# Patient Record
Sex: Female | Born: 1950 | Race: White | Hispanic: No | Smoking: Former smoker
Health system: Southern US, Community
[De-identification: ages and names within clinical notes are randomized; demographics above are authoritative.]

## PROBLEM LIST (undated history)

## (undated) DIAGNOSIS — C189 Malignant neoplasm of colon, unspecified: Secondary | ICD-10-CM

## (undated) DIAGNOSIS — Z87442 Personal history of urinary calculi: Secondary | ICD-10-CM

## (undated) DIAGNOSIS — R569 Unspecified convulsions: Secondary | ICD-10-CM

## (undated) DIAGNOSIS — R269 Unspecified abnormalities of gait and mobility: Secondary | ICD-10-CM

## (undated) DIAGNOSIS — G629 Polyneuropathy, unspecified: Secondary | ICD-10-CM

## (undated) DIAGNOSIS — K862 Cyst of pancreas: Secondary | ICD-10-CM

## (undated) DIAGNOSIS — G8929 Other chronic pain: Secondary | ICD-10-CM

## (undated) DIAGNOSIS — R011 Cardiac murmur, unspecified: Secondary | ICD-10-CM

## (undated) DIAGNOSIS — M5440 Lumbago with sciatica, unspecified side: Secondary | ICD-10-CM

## (undated) DIAGNOSIS — M797 Fibromyalgia: Secondary | ICD-10-CM

## (undated) DIAGNOSIS — H539 Unspecified visual disturbance: Secondary | ICD-10-CM

## (undated) DIAGNOSIS — M545 Low back pain: Secondary | ICD-10-CM

## (undated) HISTORY — DX: Unspecified abnormalities of gait and mobility: R26.9

## (undated) HISTORY — DX: Other chronic pain: G89.29

## (undated) HISTORY — DX: Malignant neoplasm of colon, unspecified: C18.9

## (undated) HISTORY — DX: Cyst of pancreas: K86.2

## (undated) HISTORY — DX: Polyneuropathy, unspecified: G62.9

## (undated) HISTORY — DX: Low back pain: M54.5

## (undated) HISTORY — PX: EYE SURGERY: SHX253

## (undated) HISTORY — DX: Unspecified visual disturbance: H53.9

---

## 1997-05-11 HISTORY — PX: ABDOMINAL HYSTERECTOMY: SHX81

## 2010-05-11 DIAGNOSIS — C189 Malignant neoplasm of colon, unspecified: Secondary | ICD-10-CM | POA: Insufficient documentation

## 2010-05-11 HISTORY — DX: Malignant neoplasm of colon, unspecified: C18.9

## 2010-05-11 HISTORY — PX: COLON SURGERY: SHX602

## 2015-05-12 HISTORY — PX: COLONOSCOPY: SHX174

## 2017-08-15 ENCOUNTER — Encounter (HOSPITAL_COMMUNITY): Payer: Self-pay | Admitting: Emergency Medicine

## 2017-08-15 ENCOUNTER — Emergency Department (HOSPITAL_COMMUNITY)
Admission: EM | Admit: 2017-08-15 | Discharge: 2017-08-15 | Disposition: A | Discharge status: Home / Self Care | Payer: Medicare Other | Attending: Emergency Medicine | Admitting: Emergency Medicine

## 2017-08-15 DIAGNOSIS — G894 Chronic pain syndrome: Secondary | ICD-10-CM | POA: Diagnosis not present

## 2017-08-15 DIAGNOSIS — K625 Hemorrhage of anus and rectum: Secondary | ICD-10-CM | POA: Diagnosis not present

## 2017-08-15 DIAGNOSIS — R197 Diarrhea, unspecified: Secondary | ICD-10-CM

## 2017-08-15 DIAGNOSIS — Z85038 Personal history of other malignant neoplasm of large intestine: Secondary | ICD-10-CM | POA: Insufficient documentation

## 2017-08-15 HISTORY — DX: Polyneuropathy, unspecified: G62.9

## 2017-08-15 HISTORY — DX: Fibromyalgia: M79.7

## 2017-08-15 HISTORY — DX: Lumbago with sciatica, unspecified side: M54.40

## 2017-08-15 LAB — CBC
HEMATOCRIT: 40.9 % (ref 36.0–46.0)
Hemoglobin: 13.1 g/dL (ref 12.0–15.0)
MCH: 36.1 pg — ABNORMAL HIGH (ref 26.0–34.0)
MCHC: 32 g/dL (ref 30.0–36.0)
MCV: 112.7 fL — AB (ref 78.0–100.0)
PLATELETS: 307 10*3/uL (ref 150–400)
RBC: 3.63 MIL/uL — AB (ref 3.87–5.11)
RDW: 15.4 % (ref 11.5–15.5)
WBC: 8.6 10*3/uL (ref 4.0–10.5)

## 2017-08-15 LAB — COMPREHENSIVE METABOLIC PANEL
ALT: 39 U/L (ref 14–54)
AST: 35 U/L (ref 15–41)
Albumin: 4.1 g/dL (ref 3.5–5.0)
Alkaline Phosphatase: 133 U/L — ABNORMAL HIGH (ref 38–126)
Anion gap: 12 (ref 5–15)
BILIRUBIN TOTAL: 0.6 mg/dL (ref 0.3–1.2)
BUN: 13 mg/dL (ref 6–20)
CHLORIDE: 102 mmol/L (ref 101–111)
CO2: 24 mmol/L (ref 22–32)
CREATININE: 0.61 mg/dL (ref 0.44–1.00)
Calcium: 9.5 mg/dL (ref 8.9–10.3)
GFR calc Af Amer: >60 mL/min (ref >60)
Glucose, Bld: 116 mg/dL — ABNORMAL HIGH (ref 65–99)
POTASSIUM: 3.7 mmol/L (ref 3.5–5.1)
Sodium: 138 mmol/L (ref 135–145)
TOTAL PROTEIN: 7.9 g/dL (ref 6.5–8.1)

## 2017-08-15 LAB — TYPE AND SCREEN
ABO/RH(D): O POS
ANTIBODY SCREEN: NEGATIVE

## 2017-08-15 MED ORDER — OXYCODONE-ACETAMINOPHEN 5-325 MG PO TABS
1.0000 | ORAL_TABLET | Freq: Once | ORAL | Status: AC
  Administered 2017-08-15: 1 via ORAL
  Filled 2017-08-15: qty 1

## 2017-08-15 NOTE — ED Notes (Signed)
Pt states she is here from [place] and is here due to a family emergency  She repeatedly request pain meds for her  Chronic GI bld from radiation, chemo from her cancer six years ago  She is conversant and manipulative in her request for pain meds speaking to RN of her family event of son going thru a difficult divorce and there being children involved, her rectal bleeding and her need for pain meds

## 2017-08-15 NOTE — ED Triage Notes (Signed)
She is also out of her percocet.

## 2017-08-15 NOTE — ED Triage Notes (Signed)
Pt states that she is having rectal bleeding with diarrhea for the past couple of days

## 2017-08-15 NOTE — ED Notes (Signed)
Orthostatic VS  Lying: 140/81 91,16, pulse ox 97 percent RA  Sitting    143/70  88,16,  Pulse ox 99 percent RA  Standing   142/78   92,18. Pulse Ox 97 percent RA

## 2017-08-15 NOTE — Discharge Instructions (Signed)
Take your medications per their instructions.

## 2017-08-15 NOTE — ED Notes (Signed)
POC occult blood set to room

## 2017-08-16 NOTE — ED Provider Notes (Signed)
[facility] EMERGENCY DEPARTMENT Provider Note   CSN: [id number] Arrival date & time: 08/15/17  1711     History   Chief Complaint Chief Complaint  Patient presents with  . Rectal Bleeding    HPI Carla Dunlap is a 67 y.o. female.  HPI Patient presents for diarrhea and rectal bleeding.  She is visiting from out of state for family emergency.  States she has chronic diarrhea and chronic bleeding since previous colon cancer and surgery.  States that there is a little more bleeding now than normal.  She also states she has severe pain in her back.  States she jumped out of a building previously and has had pain since.  She goes to pain clinic in [place] but is not had her meds for the reportedly last month and a half. States she needs more pain medicines while she is down here.  Is Has dull abdominal pain.  States the pain in her back is schronic.  No new injury.   Past Medical History:  Diagnosis Date  . Back pain of lumbar region with sciatica   . Cancer ([place])   . Fibromyalgia   . Neuropathy     There are no active problems to display for this patient.   History reviewed. No pertinent surgical history.   OB History   None      Home Medications    Prior to Admission medications   Not on File    Family History No family history on file.  Social History Social History   Tobacco Use  . Smoking status: Not on file  Substance Use Topics  . Alcohol use: Not on file  . Drug use: Not on file     Allergies   Anti-inflammatory enzyme [nutritional supplements]; Aspirin; and Penicillins   Review of Systems Review of Systems  Constitutional: Negative for appetite change.  HENT: Negative for congestion.   Respiratory: Negative for shortness of breath.   Gastrointestinal: Positive for blood in stool and diarrhea.  Genitourinary: Negative for flank pain.  Musculoskeletal: Negative for gait problem.  Skin: Negative for rash.  Neurological: Negative for  weakness.  Hematological: Negative for adenopathy.     Physical Exam Updated Vital Signs BP 129/71   Pulse 84   Temp 98.1 F (36.7 C) (Oral)   Resp 16   Ht 5\' 1"  (1.549 m)   Wt 70.3 kg (155 lb)   SpO2 98%   BMI 29.29 kg/m    Physical Exam  Constitutional: She appears well-developed.  HENT:  Head: Normocephalic.  Eyes: Pupils are equal, round, and reactive to light.  Neck: Neck supple.  Cardiovascular: Normal rate.  Pulmonary/Chest: Effort normal.  Abdominal: There is no tenderness.  Musculoskeletal: She exhibits tenderness.  Tender over left lower back.  Skin: Capillary refill takes less than 2 seconds.  Psychiatric: She has a normal mood and affect.     ED Treatments / Results  Labs (all labs ordered are listed, but only abnormal results are displayed) Labs Reviewed  COMPREHENSIVE METABOLIC PANEL - Abnormal; Notable for the following components:      Result Value   Glucose, Bld 116 (*)    Alkaline Phosphatase 133 (*)    All other components within normal limits  CBC - Abnormal; Notable for the following components:   RBC 3.63 (*)    MCV 112.7 (*)    MCH 36.1 (*)    All other components within normal limits  TYPE AND SCREEN    EKG  None  Radiology No results found.  Procedures Procedures (including critical care time)  Medications Ordered in ED Medications  oxyCODONE-acetaminophen (PERCOCET/ROXICET) 5-325 MG per tablet 1 tablet (1 tablet Oral Given 08/15/17 1809)     Initial Impression / Assessment and Plan / ED Course  I have reviewed the triage vital signs and the nursing notes.  Pertinent labs & imaging results that were available during my care of the patient were reviewed by me and considered in my medical decision making (see chart for details).     Patient with chronic diarrhea with chronic GI bleeding.  Hemoglobin reassuring.  Also history of polycythemia.  Patient is rather intent on getting refill of her Percocet for her chronic pain.  I  informed the patient that we cannot refill this.  Patient appears stable in terms of GI bleed.  Given GI follow-up.  Will discharge home.  Final Clinical Impressions(s) / ED Diagnoses   Final diagnoses:  Rectal bleeding  Diarrhea, unspecified type  Chronic pain syndrome    ED Discharge Orders    None      Davonna Belling, MD 08/16/17 1448

## 2017-08-25 ENCOUNTER — Encounter: Payer: Self-pay | Admitting: Gastroenterology

## 2017-08-25 ENCOUNTER — Ambulatory Visit (INDEPENDENT_AMBULATORY_CARE_PROVIDER_SITE_OTHER): Payer: BLUE CROSS/BLUE SHIELD | Admitting: Gastroenterology

## 2017-08-25 DIAGNOSIS — R1030 Lower abdominal pain, unspecified: Secondary | ICD-10-CM | POA: Diagnosis not present

## 2017-08-25 DIAGNOSIS — R197 Diarrhea, unspecified: Secondary | ICD-10-CM

## 2017-08-25 DIAGNOSIS — K219 Gastro-esophageal reflux disease without esophagitis: Secondary | ICD-10-CM | POA: Diagnosis not present

## 2017-08-25 MED ORDER — OMEPRAZOLE 20 MG PO CPDR: DELAYED_RELEASE_CAPSULE | ORAL | 11 refills | Status: DC

## 2017-08-25 NOTE — Assessment & Plan Note (Signed)
ETIOLOGY UNCLEAR. DIFFERENTIAL DIAGNOSIS INCLUDES: ISCHEMIC COLITIS MICROSCOPIC COLITIS, LESS LIKELY COLON CANCER, C DIFF COLITIS, OR IBD.  SUBMIT STOOL STUDY. PLEASE CALL ME IF ANYTHING CHANGES. COMPLETE CT OF THE ABDOMEN AND PELVIS. NO INDICATION FOR ENDOSCOPY AT THIS TIME. FOLLOW UP IN 1 YEAR.

## 2017-08-25 NOTE — Assessment & Plan Note (Addendum)
SYMPTOMS NOT IDEALLY CONTROLLED.  TO CONTROL HEART BURN:    1. AVOID REFLUX TRIGGERS.  HANDOUT GIVEN.    2. START OMEPRAZOLE.  TAKE 30 MINUTES PRIOR TO YOUR FIRST MEAL. FOLLOW UP IN 1 YEAR.

## 2017-08-25 NOTE — Patient Instructions (Addendum)
PLEASE CALL ME IF ANYTHING CHANGES.  TO REDUCE PAIN AND INFLAMMATION, TAKE TURMERIC PILLS DAILY.  CALL THE [facility] TO ESTABLISH PRIMARY CARE.   TO CONTROL HEART BURN:    1. AVOID REFLUX TRIGGERS. SEE INFO BELOW.    2. START OMEPRAZOLE.  TAKE 30 MINUTES PRIOR TO YOUR FIRST MEAL.  TO WORK OF BLOODY STOOLS:   1. COMPLETE CT OF THE ABDOMEN AND PELVIS.   2. SUBMIT STOOL STUDY  FOLLOW UP IN 1 YEAR. PLEASE CALL WITH QUESTIONS OR CONCERNS.

## 2017-08-25 NOTE — Progress Notes (Signed)
Subjective:    Patient ID: Carla Dunlap, female    DOB: 1950/07/20, 67 y.o.   MRN: [id number]  Patient, No Pcp Per  HPI Had colon cancer and had treatment. Left with peripheral neuropathy. Has Fibromyalgia.  And now having LOOSE STOOLS and bloody stools. CAME FROM [place] tired and fighting pain with Tylenol. HAD LABS APR 2019 AND NL Hb AND LIVER ENZYMES. HAD LN INVOLVEMENT WITH COLON CANCER. BMs: 6-7/DAY(#4, 1, AND 5). AND COLONOSCOPY 1-2 YRS AGO.  SEEN IN ED FOR PAIN IN HER BELLY AND TODAY CONTINUES WITH LOWER ABDOMINAL PAIN. DID NOT MOVE BOWELS WELL THIS AM. FEELS CONSTIPATED TODAY. FEELS NAUSEATED EVERY DAY AND TAKES COMPAZINE SINCE SURGERY. LOST SOME WEIGHT ON PURPOSE. LEFT PHILI TO [place] AND EVERYTHING SHE HAD WAS STOLEN. LIVING WITH HER BEST FRIEND. CAN HAVE BM AFTER SPINACH OR PRUNES. VOMITS: 1-2 TIMES A MO. HAD EGD > 5 YRS AGO. OCCASIONAL BLACK TARRY STOOLS.  PT DENIES FEVER, CHILLS,  HEMATEMESIS, CHEST PAIN, SHORTNESS OF BREATH,  problems swallowing, OR problems with sedation.  Past Medical History:  Diagnosis Date  . Back pain of lumbar region with sciatica   . Cancer ([place])   . Fibromyalgia   . Neuropathy    Past Surgical History:  Procedure Laterality Date  . COLON SURGERY  2012  . COLONOSCOPY  2017   NO POLYPS    Allergies  Allergen Reactions  . Anti-Inflammatory Enzyme [Nutritional Supplements]   . Aspirin   . Penicillins    Current Outpatient Medications  Medication Sig Dispense Refill  . clonazePAM (KLONOPIN) 1 MG tablet 1 tablet during the day and 4 tablets at night    . cyclobenzaprine (FLEXERIL) 5 MG tablet Take 5 mg by mouth 3 (three) times daily.    Marland Kitchen FLUoxetine (PROZAC) 20 MG capsule Take 20 mg by mouth daily.    Marland Kitchen levothyroxine (SYNTHROID, LEVOTHROID) 25 MCG tablet Take 25 mcg by mouth daily before breakfast.    . prochlorperazine (COMPAZINE) 10 MG tablet Take 10 mg by mouth daily.    Marland Kitchen topiramate (TOPAMAX) 100 MG tablet Take 100 mg by mouth 2 (two)  times daily.     Family History  Problem Relation Age of Onset  . Colon cancer Neg Hx   . Colon polyps Neg Hx    Social History   Socioeconomic History  . Marital status: Divorced    Spouse name: Not on file  . Number of children: 2 SON IN [place] AND DAUGHTER IN [place]  . Years of education: Not on file  . Highest education level: Not on file  Occupational History  . USED TO BE A HEALER  Social Needs  . Financial resource strain: Not on file  . Food insecurity:    Worry: Not on file    Inability: Not on file  . Transportation needs:    Medical: Not on file    Non-medical: Not on file  Tobacco Use  . Smoking status: Current Every Day Smoker    Packs/day: 0.50  . Smokeless tobacco: Never Used  Substance and Sexual Activity  . Alcohol use: Never    Frequency: Never  . Drug use: Never  . Sexual activity: Not on file  Lifestyle  . Physical activity:    Days per week: Not on file    Minutes per session: Not on file  . Stress: Not on file  Relationships  . Social connections:    Talks on phone: Not on file    Gets  together: Not on file    Attends religious service: Not on file    Active member of club or organization: Not on file    Attends meetings of clubs or organizations: Not on file    Relationship status: Not on file  Other Topics Concern     Social History Narrative      Review of Systems PER HPI OTHERWISE ALL SYSTEMS ARE NEGATIVE.    Objective:   Physical Exam  Constitutional: She is oriented to person, place, and time. She appears well-developed and well-nourished. No distress.  HENT:  Head: Normocephalic and atraumatic.  Mouth/Throat: Oropharynx is clear and moist. No oropharyngeal exudate.  Eyes: Pupils are equal, round, and reactive to light. No scleral icterus.  Neck: Normal range of motion. Neck supple.  Cardiovascular: Normal rate, regular rhythm and normal heart sounds.  Pulmonary/Chest: Effort normal and breath sounds normal. No respiratory distress.    Abdominal: Soft. Bowel sounds are normal. She exhibits no distension. There is tenderness. There is no rebound and no guarding.  MILD BLQs TTP  Musculoskeletal: She exhibits no edema.  WALKS ASSISTED WITH A CANE.  Lymphadenopathy:    She has no cervical adenopathy.  Neurological: She is alert and oriented to person, place, and time.  NO FOCAL DEFICITS  Psychiatric: She has a normal mood and affect.  Vitals reviewed.     Assessment & Plan:

## 2017-08-26 NOTE — Progress Notes (Signed)
ON RECALL  °

## 2017-08-26 NOTE — Progress Notes (Signed)
No pcp per patient 

## 2017-08-30 NOTE — Patient Instructions (Signed)
Called Pre-certification phone number on back of [place] card. Spoke to [place] at [employer]. She states [employer] doesn't handle precertification for pt's plan. She advised to call Customer Services number on back of insurance card.  Called Customer Service number on back of insurance card. Spoke to [place]. He advised BCBS is a Medicare Supplement, Medicare is primary insurance for pt. Therefore, no PA needed for CT abd/pelvis. Ref# [id number].

## 2017-09-02 ENCOUNTER — Telehealth: Payer: Self-pay | Admitting: Gastroenterology

## 2017-09-02 NOTE — Telephone Encounter (Signed)
[phone]  PATIENT HAS A PROCEDURE NEXT WEEK AND WANTED TO LET us KNOW THAT HER POOP IS NOW COMING OUT VERY BLACK

## 2017-09-02 NOTE — Telephone Encounter (Signed)
Agree, no further recommendations. Dark stools difficult to tell melena vs. Pepto. Pain 9/10 should be evaluated by ER along with Hgb and Heme stool.

## 2017-09-02 NOTE — Telephone Encounter (Signed)
Pt saw Dr. Oneida Alar on 08/25/2017. At that time she said that she occasionally has black tarry stools.  She said that since her cancer she sits in the bathroom everyday from 9-1 using the bathroom with loose stools. She took Pepto Bismol x 2 days ago and her stools have been black since then. She complains of pain in right and left groin areas and rates it at a 9 now.  I spoke to Walden Field, NP who is taking calls and he said she should go to ED to be examined and have blood work and stools checked. I have informed pt to do so and she agreed.

## 2017-09-08 ENCOUNTER — Ambulatory Visit (HOSPITAL_COMMUNITY)
Admission: RE | Admit: 2017-09-08 | Discharge: 2017-09-08 | Disposition: A | Discharge status: Home / Self Care | Payer: Medicare Other | Source: Ambulatory Visit | Attending: Gastroenterology | Admitting: Gastroenterology

## 2017-09-08 DIAGNOSIS — S2231XA Fracture of one rib, right side, initial encounter for closed fracture: Secondary | ICD-10-CM | POA: Diagnosis not present

## 2017-09-08 DIAGNOSIS — I700 Atherosclerosis of aorta: Secondary | ICD-10-CM | POA: Diagnosis not present

## 2017-09-08 DIAGNOSIS — K5730 Diverticulosis of large intestine without perforation or abscess without bleeding: Secondary | ICD-10-CM | POA: Insufficient documentation

## 2017-09-08 DIAGNOSIS — N200 Calculus of kidney: Secondary | ICD-10-CM | POA: Insufficient documentation

## 2017-09-08 DIAGNOSIS — X58XXXA Exposure to other specified factors, initial encounter: Secondary | ICD-10-CM | POA: Diagnosis not present

## 2017-09-08 DIAGNOSIS — K869 Disease of pancreas, unspecified: Secondary | ICD-10-CM | POA: Diagnosis not present

## 2017-09-08 DIAGNOSIS — I2510 Atherosclerotic heart disease of native coronary artery without angina pectoris: Secondary | ICD-10-CM | POA: Insufficient documentation

## 2017-09-08 DIAGNOSIS — R1030 Lower abdominal pain, unspecified: Secondary | ICD-10-CM | POA: Diagnosis not present

## 2017-09-08 MED ORDER — IOPAMIDOL (ISOVUE-300) INJECTION 61%
100.0000 mL | Freq: Once | INTRAVENOUS | Status: AC | PRN
  Administered 2017-09-08: 100 mL via INTRAVENOUS

## 2017-09-09 ENCOUNTER — Telehealth: Payer: Self-pay

## 2017-09-09 NOTE — Telephone Encounter (Signed)
REVIEWED-NO ADDITIONAL RECOMMENDATIONS. 

## 2017-09-09 NOTE — Telephone Encounter (Signed)
T/C from [place] at [facility], called to inform Dr. Oneida Alar of an Addendum to the CT report.  I told Diane the Addendum is appearing in chart and I will make Dr. Oneida Alar aware.

## 2017-09-15 ENCOUNTER — Encounter: Payer: Self-pay | Admitting: Gastroenterology

## 2017-09-15 ENCOUNTER — Telehealth: Payer: Self-pay | Admitting: Gastroenterology

## 2017-09-15 NOTE — Telephone Encounter (Addendum)
PLEASE CALL PT. CALL PT. HER CT SHOWED NO No acute findings in the abdomen or pelvis to account for the patient's symptoms. MOST LIKELY THE BLOATING IS DUE TO WHAT SHE IS EATING. SHE SHOULD AVOID ITEMS THAT CAUSE BLOATING & GAS.  THE COMPLETE FINDINGS ON HER CT ARE AS FOLLOWS:   1. Extensive colonic diverticulosis 2. SMALL RIGHT KIDNEY STONE 3. HARDENING OF THE ARTERIES N HER ABDOMEN AND HEART. 4. Mildly displaced fracture of the right eighth rib. 5. A SMALL INDETERMINATE lesion in the body of the pancreas. SHE NEEDS A repeat evaluation with pancreatic protocol CT scan in 6 months to reassess this lesion.

## 2017-09-15 NOTE — Telephone Encounter (Addendum)
Pt said she has been having a lot of nausea since her CT scan one week ago, had a little vomiting, but not now.  She has problems with Bm's anyway, and stays in the bathroom about 5 hours a day. She said the stools are formed, and not watery.  She is feeling very tired a lot. Also, she feels bloated a lot and thinks it might be coming from Boost, although she needs her boost. Please advise!

## 2017-09-15 NOTE — Telephone Encounter (Signed)
Patient called and stated she has been tired and sick to her stomach after her ct scan.  Please call [phone]

## 2017-09-16 NOTE — Telephone Encounter (Signed)
PT is aware.

## 2017-09-16 NOTE — Telephone Encounter (Signed)
Reminder in epic °

## 2017-09-27 ENCOUNTER — Telehealth: Payer: Self-pay

## 2017-09-27 DIAGNOSIS — R197 Diarrhea, unspecified: Secondary | ICD-10-CM

## 2017-09-27 NOTE — Telephone Encounter (Signed)
I received a phone call from [place], who said she took the homeless pt in. She said pt does not tell us everything that is wrong with her and she is trying to get some help for her. Lelon Frohlich has another friend that drives the pt to appts and was with her at appt here. However, pt did not sign for anyone to get information and I told Lelon Frohlich that. I told her it is really nothing we can do since pt has not signed for [place] to speak with anyone.  She said she is in the process of getting medical POA, said she already has the general POA.  Pt's only son lives in another state.  I told her I would let my office manager know.  Her call back number is [phone].

## 2017-09-27 NOTE — Telephone Encounter (Signed)
Pt called and said she would like to speak to Dr. Oneida Alar. Said she was just having some problems and would like to speak with her. I told her that Dr. Oneida Alar is at the hospital doing procedures. She said she has a BM everyday but it takes her about 3 hours to have it. Said the stool is soft sometimes and hard sometimes.  She has some bright red blood when she has the BM.  She wanted to see Dr. Oneida Alar right away and I told her she does not have anything right away. I told her I would send Dr. Oneida Alar a message and let her advise.

## 2017-09-27 NOTE — Telephone Encounter (Signed)
I spoke with Ms. Orene Desanctis and made her aware of our Privacy policy.

## 2017-09-27 NOTE — Telephone Encounter (Signed)
REVIEWED. AGREE. NO ADDITIONAL RECOMMENDATIONS. 

## 2017-10-05 NOTE — Telephone Encounter (Signed)
Pt called and said she is NOT constipated. Said she has diarrhea and it takes her about 5 hours of sitting on the toilet everyday to finish. I read back what she told me last time about the sometimes soft stool and sometimes hard stool. She said that must have been one of the days when I was feeling so bad I didn't realize what I was saying. She said again, " I am never constipated". She said she was previously given Lomotil by another doctor and it helped her diarrhea.

## 2017-10-05 NOTE — Telephone Encounter (Signed)
Called patient TO DISCUSS CONCERNS. LVM-CALL [phone] TO DISCUSS. NEEDS AMITIZA OR LINZESS TO TREAT CONSTIPATION.

## 2017-10-12 NOTE — Telephone Encounter (Signed)
Called patient TO DISCUSS CONCERNS. LVM-CALL [phone] TO DISCUSS. May use imodium 2-3 times a day to control her diarrhea. WILL SEND LOMOTIL AFTER SPEAKING WITHPT

## 2017-10-13 ENCOUNTER — Telehealth: Payer: Self-pay | Admitting: Gastroenterology

## 2017-10-13 NOTE — Telephone Encounter (Signed)
PT is aware. She will be home this afternoon after 4:30 pm. Tomorrow she will be home and available after 2:00 pm.

## 2017-10-13 NOTE — Telephone Encounter (Signed)
[phone] PATIENT CALLED AND STATED THAT THE OTC DIARRHEA MEDS IS NOT WORKING AND SHE IS REQUESTING LIMODIL

## 2017-10-13 NOTE — Telephone Encounter (Signed)
See previous note

## 2017-10-14 ENCOUNTER — Telehealth: Payer: Self-pay | Admitting: Gastroenterology

## 2017-10-15 LAB — CBC WITH DIFFERENTIAL/PLATELET
BASOS ABS: 26 {cells}/uL (ref 0–200)
BASOS PCT: 0.3 %
EOS PCT: 0.5 %
Eosinophils Absolute: 44 cells/uL (ref 15–500)
HEMATOCRIT: 37.7 % (ref 35.0–45.0)
HEMOGLOBIN: 13.2 g/dL (ref 11.7–15.5)
LYMPHS ABS: 1241 {cells}/uL (ref 850–3900)
MCH: 37.5 pg — ABNORMAL HIGH (ref 27.0–33.0)
MCHC: 35 g/dL (ref 32.0–36.0)
MCV: 107.1 fL — ABNORMAL HIGH (ref 80.0–100.0)
MPV: 9.5 fL (ref 7.5–12.5)
Monocytes Relative: 4.8 %
NEUTROS ABS: 7066 {cells}/uL (ref 1500–7800)
Neutrophils Relative %: 80.3 %
Platelets: 324 10*3/uL (ref 140–400)
RBC: 3.52 10*6/uL — AB (ref 3.80–5.10)
RDW: 12.9 % (ref 11.0–15.0)
Total Lymphocyte: 14.1 %
WBC mixed population: 422 cells/uL (ref 200–950)
WBC: 8.8 10*3/uL (ref 3.8–10.8)

## 2017-10-15 LAB — URINALYSIS, ROUTINE W REFLEX MICROSCOPIC
Bilirubin Urine: NEGATIVE
Glucose, UA: NEGATIVE
Hgb urine dipstick: NEGATIVE
Ketones, ur: NEGATIVE
LEUKOCYTES UA: NEGATIVE
NITRITE: NEGATIVE
PH: 7.5 (ref 5.0–8.0)
Protein, ur: NEGATIVE
SPECIFIC GRAVITY, URINE: 1.008 (ref 1.001–1.03)

## 2017-10-15 LAB — COMPLETE METABOLIC PANEL WITH GFR
AG Ratio: 1.6 (calc) (ref 1.0–2.5)
ALBUMIN MSPROF: 4.4 g/dL (ref 3.6–5.1)
ALT: 38 U/L — ABNORMAL HIGH (ref 6–29)
AST: 35 U/L (ref 10–35)
Alkaline phosphatase (APISO): 152 U/L — ABNORMAL HIGH (ref 33–130)
BUN: 8 mg/dL (ref 7–25)
CALCIUM: 9.1 mg/dL (ref 8.6–10.4)
CO2: 26 mmol/L (ref 20–32)
CREATININE: 0.78 mg/dL (ref 0.50–0.99)
Chloride: 101 mmol/L (ref 98–110)
GFR, EST NON AFRICAN AMERICAN: 78 mL/min/{1.73_m2} (ref >=60)
GFR, Est African American: 91 mL/min/{1.73_m2} (ref >=60)
GLOBULIN: 2.7 g/dL (ref 1.9–3.7)
Glucose, Bld: 111 mg/dL — ABNORMAL HIGH (ref 65–99)
Potassium: 3.9 mmol/L (ref 3.5–5.3)
SODIUM: 135 mmol/L (ref 135–146)
Total Bilirubin: 0.2 mg/dL (ref 0.2–1.2)
Total Protein: 7.1 g/dL (ref 6.1–8.1)

## 2017-10-15 MED ORDER — DIPHENOXYLATE-ATROPINE 2.5-0.025 MG PO TABS: ORAL_TABLET | ORAL | 0 refills | Status: DC

## 2017-10-15 NOTE — Addendum Note (Signed)
Addended by: Danie Binder on: 10/15/2017 08:52 AM   Modules accepted: Orders

## 2017-10-15 NOTE — Telephone Encounter (Signed)
Pt is aware of OV on Tuesday 6/11 at 11 with EG

## 2017-10-15 NOTE — Telephone Encounter (Signed)
REVIEWED-NO ADDITIONAL RECOMMENDATIONS. 

## 2017-10-15 NOTE — Telephone Encounter (Addendum)
Called patient TO DISCUSS CONCERNS. Diarrhea with blood for 3-4 hrs. Dr. Everette Rank said she needs to be seen immediately. Needs urgent appt within 7 days. Associated with chills, nausea/vomiting couple times a week(no blood), abdominal pain: sharp, stays low, in groin, rectal pain, dysuria, heartburn:burning in chest. BMs: TNTC. NO ABX OR TRAVEL. Taking TUMS AND PEPTO BISMOL. IMODIUM DOESN'T WORK. 2 YRS AGO TCS: SCREENING FOR COLON CANCER. PCP: [facility]. OFF OF ALL MILK PRODUCTS. FEELING BETTER. PT DENIES FEVER, hematuria, HEMATOCHEZIA, HEMATEMESIS, melena, CHEST PAIN, SHORTNESS OF BREATH, CHANGE IN BOWEL IN HABITS, constipation, problems swallowing, rectal itching/pressure, problems with sedation, or heartburn or indigestion. EATING YOGURT, GINGER WATER, KOMBUCHA.  FOLLOW A LACTOSE FREE DIET. NEEDS CBC/UA, AND STOOL STUDIES: C DIFF, GIARDIA Ag, FECAL LACTOFERRIN.  OPV NEXT WEEK IN URGENT SPOT, Dx: CHANGE IN BOWEL HABITS,RECTAL BLEEDING.

## 2017-10-15 NOTE — Telephone Encounter (Signed)
PT's friend is aware they will need to pick up stool containers at [place].  Orders faxed to [place].

## 2017-10-18 ENCOUNTER — Telehealth: Payer: Self-pay

## 2017-10-18 NOTE — Telephone Encounter (Signed)
Pt left Vm that she needs to know where to take the stool samples. I called and spoke to [facility] who is aware to take to [facility].

## 2017-10-19 ENCOUNTER — Other Ambulatory Visit: Payer: Self-pay

## 2017-10-19 ENCOUNTER — Encounter: Payer: Self-pay | Admitting: Nurse Practitioner

## 2017-10-19 ENCOUNTER — Telehealth: Payer: Self-pay | Admitting: Gastroenterology

## 2017-10-19 ENCOUNTER — Ambulatory Visit (INDEPENDENT_AMBULATORY_CARE_PROVIDER_SITE_OTHER): Payer: BLUE CROSS/BLUE SHIELD | Admitting: Nurse Practitioner

## 2017-10-19 VITALS — BP 129/81 | HR 98 | Temp 98.4°F | Ht 60.0 in | Wt 160.0 lb

## 2017-10-19 DIAGNOSIS — D539 Nutritional anemia, unspecified: Secondary | ICD-10-CM

## 2017-10-19 DIAGNOSIS — R197 Diarrhea, unspecified: Secondary | ICD-10-CM | POA: Diagnosis not present

## 2017-10-19 DIAGNOSIS — Z85038 Personal history of other malignant neoplasm of large intestine: Secondary | ICD-10-CM

## 2017-10-19 LAB — FECAL LACTOFERRIN, QUANT
Fecal Lactoferrin: POSITIVE — AB
MICRO NUMBER: 90695124
SPECIMEN QUALITY: ADEQUATE

## 2017-10-19 LAB — CLOSTRIDIUM DIFFICILE TOXIN B, QUALITATIVE, REAL-TIME PCR: Toxigenic C. Difficile by PCR: NOT DETECTED

## 2017-10-19 NOTE — Progress Notes (Signed)
No pcp per patient 

## 2017-10-19 NOTE — Telephone Encounter (Signed)
Pt is aware of results and will go to the lab for the other tests.

## 2017-10-19 NOTE — Assessment & Plan Note (Signed)
Patient states she had colon cancer about 6 years ago.  Status post surgical resection, chemotherapy, radiation.  She thinks they removed too much of her bowel and did too much chemotherapy and radiation and she subsequently has loose stools since then, peripheral neuropathy.  She states she is on Lomotil and sits on the toilet for 2 hours.  She insists she has active liquid/loose stools coming from her rectum every second of every minute of both hours, every day.  She also notes bleeding for the past 6 years.  Her recent CBC is normal in regards to hemoglobin.  Her last colonoscopy was 2 years ago in [place].  We will request previous records, colonoscopy reports, pathology.  I recommended a colonoscopy to further evaluate given her history of colon cancer and bloody diarrhea.  She declines.  She states she may consider it in about another year or 2.  She has not been sent to oncology locally yet (she moved here 4 months ago).  Ironically, she thought we were her oncologist.  At this point I recommend continue Lomotil, refer to hematology/oncology to follow-up on history of colon cancer for appropriate surveillance based on guidelines.  Return for follow-up here in 3 months.  Notify us of any worsening bleeding, anemia symptoms.  Request progress report in 1 month on how she is doing.

## 2017-10-19 NOTE — Progress Notes (Addendum)
REVIEWED-NO ADDITIONAL RECOMMENDATIONS.  Referring Provider: No ref. provider found Primary Care Physician:  Patient, No Pcp Per Primary GI:  Dr. Oneida Alar  Chief Complaint  Patient presents with  . Rectal Bleeding    bright red  . Diarrhea    HPI:   Carla Dunlap is a 67 y.o. female who presents for follow-up on diarrhea and rectal bleeding.  The patient was last seen in our office 08/25/2017 for the same as well as GERD and lower abdominal pain.  History of colon cancer status post treatment.  At that time noted loose stools and bloody stools, increasing fatigue.  Normal hemoglobin and liver enzymes in April 2019.  Having 6-7 bowel movements a day variable between [facility] 1 and 5.  Colonoscopy 1 to 2 years ago.  Persistent lower abdominal pain.  Feels constipated at her last visit.  Vomits 1-2 times a month, EGD greater than 5 years ago.  Notes occasional black tarry stools.  Recommended avoid reflux triggers, start omeprazole, CT of the abdomen and pelvis, stool studies, follow-up in 1 year.  CT was completed early May 2019.  A lot of nausea since CT, some vomiting.  Bowel movements for 5 hours a day which are formed but not watery.  Increasing fatigue.  Bloating with boost, but she needs this for nutrition.  CT found no acute findings, most likely bloating is due to what she is eating and she should avoid trigger foods.  Full findings as per below.  She again called our office 09/27/2017 and noted bowel movement every day but takes 3 hours to have it varies between hard stools and soft stools.  Some bright red blood.  Recommended Amitiza or Linzess for constipation.  Patient called back and said she is not constipated and that she has diarrhea and it takes 5 hours to have her complete bowel movement.  She was read her previous phone note and stated "it must be 1 of those days Ros feeling so bad I did not realize what I was saying."  She reemphasized that "I am never constipated."  Lomotil by  another provider previously helped.  Recommended Imodium 2-3 times a day, can consider Lomotil after speaking with patient.  After fully speaking with the patient noted diarrhea with blood 3 to 4 hours.  Dr. Emilee Hero told her she needed to be seen immediately.  She was scheduled with an urgent appointment (today).  Associated chills, nausea/vomiting a couple times a week, abdominal pain which is sharp, low in the groin, rectal pain, dysuria, heartburn in the chest.  Bowel movements too numerous to count.  No antibiotics or travel, taking Tums and Pepto-Bismol.  Imodium did not help.  Screening colonoscopy 2 years ago for colon cancer.  She is off all milk products and feeling somewhat better.  No other red flag/warning signs or symptoms.  Eating yogurt, ginger water, can be good.  Recommended CBC, urinalysis, stool studies including C. difficile, Giardia, fecal lactoferrin.  CBC, urinalysis, CMP completed 10/15/2017.  Noted no leukocytosis, normal hemoglobin at 13.7, normal platelets at 324.  Urinalysis essentially normal.  CMP found normal creatinine 0.78, normal electrolytes, elevated alk phos at 152, mildly elevated ALT at 38.  No liver work-up in our system.  Stool studies not been completed yet. She did call yesterday asking where to take the samples. No results yet.  Today she states she's had diarrhea for 6 years. Is on Lomotil and this is helping. She insists she is on the toilet for 2  straight hours and has "constant bowel movement for every second of all two hours." This is all since colon cancer s/p treatment 2 years ago. She thinks they took out too much bowel. Diagnosed in [facility] in [place] by Dr. Ilda Basset. 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 She then states "they thought it was appendicitis!!" After her last TCS she was told she was fine and didn't need another one for 5 years. She states she has had repeat CT scans in [place]. She moved here about 4 months ago. She has not been set up with an oncologist ("I thought you were my oncologist"). Started having persistent intermittent rectal bleeding for 6 years. When she was previously on Lomotil, her rectal bleeding was better. Has lower abdominal pain/groin pain "where the surgery was done, but I take Percocet twice a day and Tylenol twice a day." Has N/V as well, but "not since the Lomotil." Also with unexplainable fevers "sometimes." Denies unintentional weight loss. Denies chest pain, dyspnea, dizziness, lightheadedness, syncope, near syncope. Denies any other upper or lower GI symptoms.  She took stool samples to the lab yesterday.  A minimum of 30 minutes was spent with the patient of which at least 50% was spent on care coordination and education  Past Medical History:  Diagnosis Date  . Back pain of lumbar region with sciatica   . Colon cancer ([place]) 2012  . Fibromyalgia   . Neuropathy     Past Surgical History:  Procedure Laterality Date  . ABDOMINAL HYSTERECTOMY  1999  . CESAREAN SECTION     x 2  . COLON SURGERY  2012  . COLONOSCOPY  2017   NO POLYPS  . EYE SURGERY      Current Outpatient Medications  Medication Sig Dispense Refill  . clonazePAM (KLONOPIN) 1 MG tablet 1 tablet during the day and 4 tablets at night    . cyclobenzaprine (FLEXERIL) 5 MG tablet Take 5 mg by mouth 3 (three) times daily.    . diphenoxylate-atropine (LOMOTIL) 2.5-0.025 MG tablet 1 PO IN THE AM AND QHS 60 tablet 0  . FLUoxetine (PROZAC) 20 MG capsule Take 20 mg by mouth daily.    Marland Kitchen levothyroxine (SYNTHROID, LEVOTHROID) 25 MCG tablet Take 25 mcg by mouth daily before breakfast.    . omeprazole (PRILOSEC) 20 MG capsule 1  po every morning 30 minutes prior to your first meal. 31 capsule 11  . oxyCODONE-acetaminophen (PERCOCET) 10-325 MG tablet Take 1 tablet by mouth 2 (two) times daily.    . prochlorperazine (COMPAZINE) 10 MG tablet Take 10 mg by mouth daily.    Marland Kitchen topiramate (TOPAMAX) 100 MG tablet Take 100 mg by mouth 2 (two) times daily.     No current facility-administered medications for this visit.     Allergies as of 10/19/2017 - Review Complete 10/19/2017  Allergen Reaction Noted  . Anti-inflammatory enzyme [nutritional supplements]  08/15/2017  . Aspirin  08/15/2017  . Penicillins  08/15/2017    Family History  Problem Relation Age of Onset  . Colon cancer Neg Hx   . Colon polyps Neg Hx     Social History   Socioeconomic History  . Marital status: Divorced    Spouse name: Not on file  . Number  of children: Not on file  . Years of education: Not on file  . Highest education level: Not on file  Occupational History  . Not on file  Social Needs  . Financial resource strain: Not on file  . Food insecurity:    Worry: Not on file    Inability: Not on file  . Transportation needs:    Medical: Not on file    Non-medical: Not on file  Tobacco Use  . Smoking status: Current Every Day Smoker    Packs/day: 0.50  . Smokeless tobacco: Never Used  Substance and Sexual Activity  . Alcohol use: Never    Frequency: Never  . Drug use: Never  . Sexual activity: Not on file  Lifestyle  . Physical activity:    Days per week: Not on file    Minutes per session: Not on file  . Stress: Not on file  Relationships  . Social connections:    Talks on phone: Not on file    Gets together: Not on file    Attends religious service: Not on file    Active member of club or organization: Not on file    Attends meetings of clubs or organizations: Not on file    Relationship status: Not on file  Other Topics Concern  . Not on file  Social History Narrative  . Not on file    Review of  Systems: Complete ROS negative except as per HPI.   Physical Exam: BP 129/81   Pulse 98   Temp 98.4 F (36.9 C) (Oral)   Ht 5' (1.524 m)   Wt 160 lb (72.6 kg)   BMI 31.25 kg/m  General:   Alert and oriented. Pleasant and cooperative. Well-nourished and well-developed. Apparent unsteady gait. Eyes:  Without icterus, sclera clear and conjunctiva pink.  Ears:  Normal auditory acuity. Cardiovascular:  S1, S2 present without murmurs appreciated. Extremities without clubbing or edema. Respiratory:  Clear to auscultation bilaterally. No wheezes, rales, or rhonchi. No distress.  Gastrointestinal:  +BS, soft, non-tender and non-distended. No HSM noted. No guarding or rebound. No masses appreciated.  Rectal:  Deferred  Musculoskalatal:  Symmetrical without gross deformities. Neurologic:  Alert and oriented x4;  grossly normal neurologically. Psych:  Alert and cooperative. Normal mood and affect. Heme/Lymph/Immune: No excessive bruising noted.    10/19/2017 11:41 AM   Disclaimer: This note was dictated with voice recognition software. Similar sounding words can inadvertently be transcribed and may not be corrected upon review.

## 2017-10-19 NOTE — Telephone Encounter (Signed)
Pt is aware to go to the lab for these.

## 2017-10-19 NOTE — Patient Instructions (Signed)
1. We will refer you to a local oncologist to follow-up on your scans and studies needed after colon cancer. 2. Continue taking Lomotil. 3. Call us in 1 month and let us know how you are doing.  If need be, we may change the dose of your Lomotil or add another medication on top of this. 4. Let us know if you have any worsening bleeding or develop symptoms of significant blood loss including worsening dizziness, lightheadedness, passing out, chest pain. 5. We will have you follow-up in our office in 3 months. 6. Call us if you have any questions or concerns.  At [facility] we value your feedback. You may receive a survey about your visit today. Please share your experience as we strive to create trusting relationships with our patients to provide genuine, compassionate, quality care.  It was good to meet you today!  I hope you have a wonderful summer!!

## 2017-10-19 NOTE — Telephone Encounter (Signed)
CHECK FOR B12/FOLATE DIFICENCY.

## 2017-10-20 LAB — VITAMIN B12: VITAMIN B 12: 660 pg/mL (ref 200–1100)

## 2017-10-20 LAB — FOLATE RBC: RBC Folate: 877 ng/mL RBC (ref >280)

## 2017-10-21 ENCOUNTER — Telehealth: Payer: Self-pay

## 2017-10-21 NOTE — Telephone Encounter (Signed)
Carla Dunlap at [facility] called office. Referral was denied by Dr. Delton Coombes d/t pt is >5 years out from colon cancer. No need for CEA or CT's moving forward. Continue with colonoscopies.  Routing to EG as FYI.

## 2017-10-22 NOTE — Telephone Encounter (Signed)
Noted. Please notify patient of Heme/Onc recommendations.

## 2017-10-25 NOTE — Telephone Encounter (Signed)
Pt called office and was informed. 

## 2017-10-25 NOTE — Telephone Encounter (Signed)
Tried to call pt but she is sleeping. Asked [name] to have her call office.

## 2017-10-26 NOTE — Telephone Encounter (Signed)
Pt is aware.  

## 2017-11-15 ENCOUNTER — Encounter (HOSPITAL_COMMUNITY): Payer: Self-pay | Admitting: Family Medicine

## 2017-11-15 ENCOUNTER — Other Ambulatory Visit: Payer: Self-pay

## 2017-11-15 ENCOUNTER — Emergency Department (HOSPITAL_COMMUNITY)
Admission: EM | Admit: 2017-11-15 | Discharge: 2017-11-16 | Disposition: A | Discharge status: Other Acute Inpatient Hospital | Payer: Medicare Other | Attending: Emergency Medicine | Admitting: Emergency Medicine

## 2017-11-15 DIAGNOSIS — Z79899 Other long term (current) drug therapy: Secondary | ICD-10-CM | POA: Diagnosis not present

## 2017-11-15 DIAGNOSIS — F4323 Adjustment disorder with mixed anxiety and depressed mood: Secondary | ICD-10-CM

## 2017-11-15 DIAGNOSIS — F17200 Nicotine dependence, unspecified, uncomplicated: Secondary | ICD-10-CM | POA: Insufficient documentation

## 2017-11-15 DIAGNOSIS — F329 Major depressive disorder, single episode, unspecified: Secondary | ICD-10-CM | POA: Diagnosis not present

## 2017-11-15 LAB — CBC WITH DIFFERENTIAL/PLATELET
BASOS ABS: 0 10*3/uL (ref 0.0–0.1)
BASOS PCT: 0 %
Eosinophils Absolute: 0 10*3/uL (ref 0.0–0.7)
Eosinophils Relative: 0 %
HEMATOCRIT: 38.7 % (ref 36.0–46.0)
HEMOGLOBIN: 12.8 g/dL (ref 12.0–15.0)
Lymphocytes Relative: 11 %
Lymphs Abs: 1.1 10*3/uL (ref 0.7–4.0)
MCH: 37.9 pg — ABNORMAL HIGH (ref 26.0–34.0)
MCHC: 33.1 g/dL (ref 30.0–36.0)
MCV: 114.5 fL — ABNORMAL HIGH (ref 78.0–100.0)
MONOS PCT: 4 %
Monocytes Absolute: 0.4 10*3/uL (ref 0.1–1.0)
NEUTROS ABS: 8.5 10*3/uL — AB (ref 1.7–7.7)
NEUTROS PCT: 85 %
Platelets: 267 10*3/uL (ref 150–400)
RBC: 3.38 MIL/uL — ABNORMAL LOW (ref 3.87–5.11)
RDW: 13.7 % (ref 11.5–15.5)
WBC: 10 10*3/uL (ref 4.0–10.5)

## 2017-11-15 LAB — BASIC METABOLIC PANEL
ANION GAP: 11 (ref 5–15)
BUN: 10 mg/dL (ref 8–23)
CO2: 26 mmol/L (ref 22–32)
Calcium: 9.3 mg/dL (ref 8.9–10.3)
Chloride: 102 mmol/L (ref 98–111)
Creatinine, Ser: 0.77 mg/dL (ref 0.44–1.00)
GLUCOSE: 118 mg/dL — AB (ref 70–99)
Potassium: 3.2 mmol/L — ABNORMAL LOW (ref 3.5–5.1)
Sodium: 139 mmol/L (ref 135–145)

## 2017-11-15 LAB — RAPID URINE DRUG SCREEN, HOSP PERFORMED
AMPHETAMINES: NOT DETECTED
BENZODIAZEPINES: NOT DETECTED
COCAINE: NOT DETECTED
OPIATES: NOT DETECTED
TETRAHYDROCANNABINOL: NOT DETECTED

## 2017-11-15 LAB — ETHANOL

## 2017-11-15 LAB — SALICYLATE LEVEL: Salicylate Lvl: <7 mg/dL (ref 2.8–30.0)

## 2017-11-15 LAB — ACETAMINOPHEN LEVEL: Acetaminophen (Tylenol), Serum: <10 ug/mL — ABNORMAL LOW (ref 10–30)

## 2017-11-15 MED ORDER — OXYCODONE HCL 5 MG PO TABS
10.0000 mg | ORAL_TABLET | Freq: Once | ORAL | Status: AC
  Administered 2017-11-15: 10 mg via ORAL
  Filled 2017-11-15: qty 2

## 2017-11-15 MED ORDER — ACETAMINOPHEN 500 MG PO TABS: 1000.0000 mg | ORAL_TABLET | Freq: Two times a day (BID) | ORAL | Status: DC | PRN

## 2017-11-15 MED ORDER — ACETAMINOPHEN 325 MG PO TABS
650.0000 mg | ORAL_TABLET | Freq: Once | ORAL | Status: AC
  Administered 2017-11-15: 650 mg via ORAL
  Filled 2017-11-15: qty 2

## 2017-11-15 MED ORDER — FLUOXETINE HCL 20 MG PO CAPS
20.0000 mg | ORAL_CAPSULE | Freq: Every day | ORAL | Status: DC
  Administered 2017-11-16: 20 mg via ORAL
  Filled 2017-11-15: qty 1

## 2017-11-15 MED ORDER — OXYCODONE HCL 5 MG PO TABS
5.0000 mg | ORAL_TABLET | Freq: Two times a day (BID) | ORAL | Status: DC
  Administered 2017-11-15 – 2017-11-16 (×2): 5 mg via ORAL
  Filled 2017-11-15 (×2): qty 1

## 2017-11-15 MED ORDER — FLUTICASONE PROPIONATE 50 MCG/ACT NA SUSP
2.0000 | Freq: Every day | NASAL | Status: DC
  Administered 2017-11-16: 2 via NASAL
  Filled 2017-11-15: qty 16

## 2017-11-15 MED ORDER — LORAZEPAM 1 MG PO TABS
1.0000 mg | ORAL_TABLET | Freq: Once | ORAL | Status: AC
  Administered 2017-11-15: 1 mg via ORAL
  Filled 2017-11-15: qty 1

## 2017-11-15 MED ORDER — OXYCODONE-ACETAMINOPHEN 5-325 MG PO TABS
1.0000 | ORAL_TABLET | Freq: Two times a day (BID) | ORAL | Status: DC
  Administered 2017-11-15 – 2017-11-16 (×2): 1 via ORAL
  Filled 2017-11-15 (×2): qty 1

## 2017-11-15 MED ORDER — OXYCODONE-ACETAMINOPHEN 10-325 MG PO TABS: 1.0000 | ORAL_TABLET | Freq: Two times a day (BID) | ORAL | Status: DC

## 2017-11-15 MED ORDER — LEVOTHYROXINE SODIUM 25 MCG PO TABS
25.0000 ug | ORAL_TABLET | Freq: Every day | ORAL | Status: DC
  Administered 2017-11-16: 25 ug via ORAL
  Filled 2017-11-15: qty 1

## 2017-11-15 MED ORDER — DIPHENOXYLATE-ATROPINE 2.5-0.025 MG PO TABS
1.0000 | ORAL_TABLET | Freq: Four times a day (QID) | ORAL | Status: DC | PRN
  Administered 2017-11-16: 1 via ORAL
  Filled 2017-11-15: qty 1

## 2017-11-15 MED ORDER — TOPIRAMATE 100 MG PO TABS
100.0000 mg | ORAL_TABLET | Freq: Two times a day (BID) | ORAL | Status: DC
  Administered 2017-11-15 – 2017-11-16 (×2): 100 mg via ORAL
  Filled 2017-11-15 (×2): qty 1

## 2017-11-15 NOTE — ED Provider Notes (Signed)
Patient seen, after labs to help guide care.  She is reportedly here, because her friend who is also her power of attorney, is concerned that the patient needs additional assistance beyond but the friend is able to offer in her home where the patient is currently living.  She has reported that the patient is "disruptive and a financial burden."  5:25 PM-screening labs are normal.  TTS consultation has been requested.  I added a social work consult.  Apparently the patient is essentially homeless since the people she was living with do not want her back at their home.  Clinical Course as of Nov 16 1722  Mon Nov 15, 2017  1721 Normal except MCV elevated  CBC with Differential(!) [EW]  1721 Normal  Ethanol [EW]  3734 Normal  Salicylate level [EW]  1722 normal  Acetaminophen level(!) [EW]  1722 Normal except potassium low, glucose high  Basic metabolic panel(!) [EW]  2876 Normal  Urine rapid drug screen (hosp performed)(!) [EW]    Clinical Course User Index [EW] Daleen Bo, MD    9:30 PM-she has been seen by both TTS and social work.  TTS plans to observe her overnight and reassess with psychiatry in the morning.  Social work is also going to be on board for help if needed with placement.   Daleen Bo, MD 11/15/17 2130
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
## 2017-11-15 NOTE — ED Provider Notes (Signed)
[facility] DEPT Provider Note   CSN: [id number] Arrival date & time: 11/15/17  1306     History   Chief Complaint Chief Complaint  Patient presents with  . Psychiatric Evaluation    HPI Carla Dunlap is a 67 y.o. female.  Patient is a 67 year old female who states that she is here for psychiatric evaluation.  She is currently residing with a friend who is her power of attorney.  She also lives with the friend's husband.  Per the patient's report, the friend thinks that she is crazy and needs a psychiatric evaluation.  She does not think that she is crazy.  She denies any hallucinations.  She denies any homicidal or suicidal ideations.  She does have a letter that is written from the friend that states that patient needs a psychiatric evaluation and that she needs to possibly go live in an assisted living facility.  The friend does not want the patient coming back to live at her house.  She states that she is disruptive and a financial burden.  Patient denies any acute or recent illnesses.  She has chronic pain issues which is unchanged from her baseline.  Friend states that the patient has been taking some extra Percocet.  And that she also takes a large amount of Tylenol.     Past Medical History:  Diagnosis Date  . Back pain of lumbar region with sciatica   . Colon cancer ([place]) 2012  . Fibromyalgia   . Neuropathy     Patient Active Problem List   Diagnosis Date Noted  . Bloody diarrhea 08/25/2017  . GERD (gastroesophageal reflux disease) 08/25/2017    Past Surgical History:  Procedure Laterality Date  . ABDOMINAL HYSTERECTOMY  1999  . CESAREAN SECTION     x 2  . COLON SURGERY  2012  . COLONOSCOPY  2017   NO POLYPS  . EYE SURGERY       OB History   None      Home Medications    Prior to Admission medications   Medication Sig Start Date End Date Taking? Authorizing Provider  acetaminophen (TYLENOL) 500 MG tablet Take 1,000 mg by  mouth 2 (two) times daily as needed (break through pain).   Yes [provider]  clonazePAM (KLONOPIN) 1 MG tablet 1 tablet during the day and 4 tablets at night   Yes [provider]  cyclobenzaprine (FLEXERIL) 5 MG tablet Take 5 mg by mouth 3 (three) times daily.   Yes [provider]  diphenoxylate-atropine (LOMOTIL) 2.5-0.025 MG tablet 1 PO IN THE AM AND QHS 10/15/17  Yes Fields, Sandi L, MD  FLUoxetine (PROZAC) 20 MG capsule Take 20 mg by mouth daily.   Yes [provider]  fluticasone (FLONASE) 50 MCG/ACT nasal spray Place 2 sprays into both nostrils daily.   Yes [provider]  levothyroxine (SYNTHROID, LEVOTHROID) 25 MCG tablet Take 25 mcg by mouth daily before breakfast.   Yes [provider]  Menthol-Methyl Salicylate (MUSCLE RUB) 10-15 % CREA Apply 1 application topically as needed for muscle pain.   Yes [provider]  oxyCODONE-acetaminophen (PERCOCET) 10-325 MG tablet Take 1 tablet by mouth 2 (two) times daily.   Yes [provider]  prochlorperazine (COMPAZINE) 10 MG tablet Take 10 mg by mouth daily.   Yes [provider]  topiramate (TOPAMAX) 100 MG tablet Take 100 mg by mouth 2 (two) times daily.   Yes [provider]  omeprazole ([place])  20 MG capsule 1 po every morning 30 minutes prior to your first meal. Patient not taking: Reported on 11/15/2017 08/25/17   Danie Binder, MD    Family History Family History  Problem Relation Age of Onset  . Colon cancer Neg Hx   . Colon polyps Neg Hx     Social History Social History   Tobacco Use  . Smoking status: Current Every Day Smoker    Packs/day: 0.50  . Smokeless tobacco: Never Used  Substance Use Topics  . Alcohol use: Never    Frequency: Never  . Drug use: Never     Allergies   Anti-inflammatory enzyme [nutritional supplements]; Aspirin; and Penicillins   Review of Systems Review of Systems  Constitutional: Negative for  chills, diaphoresis, fatigue and fever.  HENT: Negative for congestion, rhinorrhea and sneezing.   Eyes: Negative.   Respiratory: Negative for cough, chest tightness and shortness of breath.   Cardiovascular: Negative for chest pain and leg swelling.  Gastrointestinal: Negative for abdominal pain, blood in stool, diarrhea, nausea and vomiting.  Genitourinary: Negative for difficulty urinating, flank pain, frequency and hematuria.  Musculoskeletal: Positive for arthralgias and back pain.  Skin: Negative for rash.  Neurological: Negative for dizziness, speech difficulty, weakness, numbness and headaches.     Physical Exam Updated Vital Signs BP 139/75 (BP Location: Left Arm)   Pulse 92   Temp 99 F (37.2 C) (Oral)   Resp 20   Ht 5' (1.524 m)   Wt 70.8 kg (156 lb)   SpO2 100%   BMI 30.47 kg/m   Physical Exam  Constitutional: She is oriented to person, place, and time. She appears well-developed and well-nourished.  HENT:  Head: Normocephalic and atraumatic.  Eyes: Pupils are equal, round, and reactive to light.  Neck: Normal range of motion. Neck supple.  Cardiovascular: Normal rate, regular rhythm and normal heart sounds.  Pulmonary/Chest: Effort normal and breath sounds normal. No respiratory distress. She has no wheezes. She has no rales. She exhibits no tenderness.  Abdominal: Soft. Bowel sounds are normal. There is no tenderness. There is no rebound and no guarding.  Musculoskeletal: Normal range of motion. She exhibits no edema.  Lymphadenopathy:    She has no cervical adenopathy.  Neurological: She is alert and oriented to person, place, and time.  Skin: Skin is warm and dry. No rash noted.  Psychiatric: She has a normal mood and affect.     ED Treatments / Results  Labs (all labs ordered are listed, but only abnormal results are displayed) Labs Reviewed  RAPID URINE DRUG SCREEN, HOSP PERFORMED - Abnormal; Notable for the following components:      Result Value    Barbiturates   (*)    Value: Result not available. Reagent lot number recalled by manufacturer.   All other components within normal limits  BASIC METABOLIC PANEL  CBC WITH DIFFERENTIAL/PLATELET  ACETAMINOPHEN LEVEL  SALICYLATE LEVEL  ETHANOL    EKG None  Radiology No results found.  Procedures Procedures (including critical care time)  Medications Ordered in ED Medications  oxyCODONE (Oxy IR/ROXICODONE) immediate release tablet 10 mg (10 mg Oral Given 11/15/17 1535)     Initial Impression / Assessment and Plan / ED Course  I have reviewed the triage vital signs and the nursing notes.  Pertinent labs & imaging results that were available during my care of the patient were reviewed by me and considered in my medical decision making (see chart for details).  Patient is a 67 year old female who presents for psychiatric evaluation.  Patient does not actually have any acute complaints.  Her labs are pending.  She is awaiting TTS evaluation.  If TTS does not deem that she has psychiatric issues that need addressing, likely social work will have to get involved to figure out if patient can go back to her current living situation or needs placement. Dr. Eulis Foster aware  Final Clinical Impressions(s) / ED Diagnoses   Final diagnoses:  None    ED Discharge Orders    None       Malvin Johns, MD 11/15/17 [phone]

## 2017-11-15 NOTE — BH Assessment (Addendum)
Assessment Note  Carla Dunlap is a 67 y.o. female, in [place] voluntarily due to the insistence of her POA, [name], whom she lives with. Per EDP note, [name] sent pt to the ED with a letter from her indicating that pt needs a psychiatric evaluation and needs to possibly go live in an assisted living facility. [name] doesn't want pt coming back to her home. Pt denies SI, HI, AVH.   The story pt tells of how she ended up living with [place] being her POA doesn't make much sense. Pt reports living in [place] and, having to move b/c they were raising the rent. She moved in with her ex-husband in [place], upon an invitation from him. The home he lived in was a "slum lord, drug infested" place and she told him she couldn't stay there. In turn, the ex-husband put pt "in [facility]". She says he knew the lady that worked there and that's how he was able to get her in. Pt reports being there in December 2018. It's unclear how long she stayed. She ended up moving to [place] with [name] over the past 4 months. At the same time, she allowed [name] to be her [place] convinced her that maybe she was crazy and needed a POA. Pt says that [name] has been very mean to her, screaming and yelling at her all the time, using demeaning language, telling her she's nothing. Pt reports that [name] kicked her out and she asked [name] for her card (pt reports having 12 million dollars at [facility] [employer] bank), but [place] refused.  Clinician discussed with pt about getting SW involved to help her get her money and rescind the POA. Clinician discussed that pt didn't need to be hospitalized (which is what pt agreed with initially). Pt then starts to cry and says that maybe she does need to be hospitalized for a little bit b/c she feels like she's having a nervous breakdown and she feels like she's "lost my will to move on". She also says that she doesn't feel that a SW is "strong enough" to help her.  She wants to "talk to a psychiatrist a few  times to see if I'm worth it."   Even though pt's story appears suspiciously untrue, pt doesn't appear to be operating in delusional thought content. She was able to appropriately answer questions and tell a linear accounting of events.  Case staffed with Jinny Blossom, NP, and pt is recommended to be observed overnight and re-assessed by psychiatry in the morning. [facility] notified.   Diagnosis: MDD, single episode, moderate  Past Medical History:  Past Medical History:  Diagnosis Date  . Back pain of lumbar region with sciatica   . Colon cancer ([place]) 2012  . Fibromyalgia   . Neuropathy     Past Surgical History:  Procedure Laterality Date  . ABDOMINAL HYSTERECTOMY  1999  . CESAREAN SECTION     x 2  . COLON SURGERY  2012  . COLONOSCOPY  2017   NO POLYPS  . EYE SURGERY      Family History:  Family History  Problem Relation Age of Onset  . Colon cancer Neg Hx   . Colon polyps Neg Hx     Social History:  reports that she has been smoking.  She has been smoking about 0.50 packs per day. She has never used smokeless tobacco. She reports that she does not drink alcohol or use drugs.  Additional  Social History:  Alcohol / Drug Use Pain Medications: see PTA med list Prescriptions: see PTA med list Over the Counter: see PTA med list History of alcohol / drug use?: No history of alcohol / drug abuse  CIWA: CIWA-Ar BP: 139/75 Pulse Rate: 92 COWS:    Allergies:  Allergies  Allergen Reactions  . Anti-Inflammatory Enzyme [Nutritional Supplements]   . Aspirin   . Penicillins     Has patient had a PCN reaction causing immediate rash, facial/tongue/throat swelling, SOB or lightheadedness with hypotension: Yes Has patient had a PCN reaction causing severe rash involving mucus membranes or skin necrosis: Yes Has patient had a PCN reaction that required hospitalization: Yes Has patient had a PCN reaction occurring within the last 10 years: No If all of the above answers are  "NO", then may proceed with Cephalosporin use.     Home Medications:  (Not in a hospital admission)  OB/GYN Status:  No LMP recorded. Patient has had a hysterectomy.  General Assessment Data [facility] Assessment: In system Is this a Tele or Face-to-Face Assessment?: Face-to-Face Is this an Initial Assessment or a Re-assessment for this encounter?: Initial Assessment Marital status: Divorced Is patient pregnant?: No Pregnancy Status: No Living Arrangements: Non-relatives/Friends Can pt return to current living arrangement?: No Admission Status: Voluntary Is patient capable of signing voluntary admission?: Yes Referral Source: Self/Family/Friend     Crisis Care Plan Living Arrangements: Non-relatives/Friends Legal Guardian: Other: Name of Psychiatrist: none Name of Therapist: none  Education Status Is patient currently in school?: No Is the patient employed, unemployed or receiving disability?: Unemployed  Risk to self with the past 6 months Suicidal Ideation: No Has patient been a risk to self within the past 6 months prior to admission? : No Suicidal Intent: No Has patient had any suicidal intent within the past 6 months prior to admission? : No Is patient at risk for suicide?: No Suicidal Plan?: No Has patient had any suicidal plan within the past 6 months prior to admission? : No Access to Means: No Previous Attempts/Gestures: No Intentional Self Injurious Behavior: None Family Suicide History: Unknown Recent stressful life event(s): Conflict (Comment) Persecutory voices/beliefs?: No Depression: Yes Depression Symptoms: Feeling angry/irritable, Feeling worthless/self pity, Tearfulness Substance abuse history and/or treatment for substance abuse?: No Suicide prevention information given to non-admitted patients: Not applicable  Risk to Others within the past 6 months Homicidal Ideation: No Does patient have any lifetime risk of violence  toward others beyond the six months prior to admission? : No Thoughts of Harm to Others: No Current Homicidal Intent: No Current Homicidal Plan: No Access to Homicidal Means: No History of harm to others?: No Assessment of Violence: None Noted Does patient have access to weapons?: No Criminal Charges Pending?: No Does patient have a court date: No Is patient on probation?: No  Psychosis Hallucinations: None noted Delusions: None noted  Mental Status Report Appearance/Hygiene: Unremarkable Eye Contact: Good Motor Activity: Unremarkable Speech: Logical/coherent Level of Consciousness: Alert Mood: Pleasant Affect: Appropriate to circumstance Anxiety Level: Minimal Thought Processes: Coherent, Relevant Judgement: Partial Orientation: Person, Place, Time, Situation Obsessive Compulsive Thoughts/Behaviors: None  Cognitive Functioning Concentration: Normal Memory: Unable to Assess Is patient IDD: No Is patient DD?: No Insight: Fair Impulse Control: Good Appetite: Fair Have you had any weight changes? : No Change Sleep: No Change Vegetative Symptoms: None  ADLScreening [facility] Assessment Services) Patient's cognitive ability adequate to safely complete daily activities?: Yes Patient able to express need for assistance  with ADLs?: Yes Independently performs ADLs?: Yes (appropriate for developmental age)  Prior Inpatient Therapy Prior Inpatient Therapy: Yes Prior Therapy Dates: December 2018 Prior Therapy Facilty/Provider(s): [facility] [place] Reason for Treatment: unknown  Prior Outpatient Therapy Prior Outpatient Therapy: Yes Prior Therapy Dates: 7 years ago Reason for Treatment: depression Does patient have an ACCT team?: No Does patient have Intensive In-House Services?  : No Does patient have [facility] services? : No Does patient have P4CC services?: No  ADL Screening (condition at time of admission) Patient's cognitive ability adequate to safely  complete daily activities?: Yes Is the patient deaf or have difficulty hearing?: No Does the patient have difficulty seeing, even when wearing glasses/contacts?: No Does the patient have difficulty concentrating, remembering, or making decisions?: No Patient able to express need for assistance with ADLs?: Yes Does the patient have difficulty dressing or bathing?: No Independently performs ADLs?: Yes (appropriate for developmental age) Does the patient have difficulty walking or climbing stairs?: No  Home Assistive Devices/Equipment Home Assistive Devices/Equipment: None    Abuse/Neglect Assessment (Assessment to be complete while patient is alone) Abuse/Neglect Assessment Can Be Completed: Yes Physical Abuse: Denies Verbal Abuse: Yes, present (Comment) Sexual Abuse: Denies Exploitation of patient/patient's resources: Denies Self-Neglect: Denies                Disposition:  Disposition Initial Assessment Completed for this Encounter: Yes  On Site Evaluation by:   Reviewed with Physician:    Rexene Edison 11/15/2017 6:34 PM

## 2017-11-15 NOTE — Progress Notes (Signed)
Consult request has been received. CSW attempting to follow up at present time.  CSW notes pt was seen by TTS and was given a recommendation to remain overnight to be seen by psychiatry on 7/9.  2nd shift ED CSW will leave handoff for 1st shift ED CSW.  Carla Dunlap. Hayla Hinger, LCSW, LCAS, CSI Clinical Social Worker Ph: [phone]

## 2017-11-15 NOTE — ED Notes (Signed)
Bed: [facility] Expected date:  Expected time:  Means of arrival:  Comments: 

## 2017-11-16 DIAGNOSIS — F4323 Adjustment disorder with mixed anxiety and depressed mood: Secondary | ICD-10-CM | POA: Diagnosis present

## 2017-11-16 MED ORDER — PROCHLORPERAZINE MALEATE 10 MG PO TABS
10.0000 mg | ORAL_TABLET | Freq: Every day | ORAL | Status: DC
  Administered 2017-11-16: 10 mg via ORAL
  Filled 2017-11-16: qty 1

## 2017-11-16 NOTE — BH Assessment (Signed)
[facility] Assessment Progress Note  Per Buford Dresser, DO, this pt does not require psychiatric hospitalization at this time.  Pt is to be discharged from [facility] with recommendation to follow up with [facility], or with the [facility].  This has been included in pt's discharge instructions, along with information regarding area supportive services for the homeless.  Pt's nurse has been notified.  Jalene Mullet, [place] Triage Specialist [phone]

## 2017-11-16 NOTE — Discharge Instructions (Signed)
For your behavioral health needs, you are advised to follow up with one of the following providers.  Contact them at your earliest opportunity to ask about scheduling an intake appointment:       [facility] [place], [place] [address]      [phone]      New patients are seen at their walk-in clinic.  Walk-in hours are Monday - Friday from 8:00 am - 12:00 pm, and from 1:00 pm - 3:00 pm.  Walk-in patients are seen on a first come, first served basis, so try to arrive as early as possible for the best chance of being seen the same day.  There is an initial fee of $22.50.       [facility]      [place], [place] [address]      [phone]  For your shelter needs, contact the following service providers:       [facility]      [place], [place] [address]      [phone]       [facility]      [address]      [place], [place] [address]      [phone]  For day shelter and other supportive services for the homeless, contact the [facility]):       [place]      [place], [place] [address]      [phone]  For transitional housing, contact one of the following agencies.  They provide longer term housing than a shelter, but there is an application process:       [facility]. [address], [place] [address]      [phone]

## 2017-11-16 NOTE — BHH Suicide Risk Assessment (Signed)
Suicide Risk Assessment  Discharge Assessment   [facility] Discharge Suicide Risk Assessment   Principal Problem: Adjustment disorder with mixed anxiety and depressed mood Discharge Diagnoses:  Patient Active Problem List   Diagnosis Date Noted  . Adjustment disorder with mixed anxiety and depressed mood [F43.23] 11/16/2017    Priority: High  . Bloody diarrhea [R19.7] 08/25/2017  . GERD (gastroesophageal reflux disease) [K21.9] 08/25/2017    Total Time spent with patient: 45 minutes  Musculoskeletal: Strength & Muscle Tone: within normal limits Gait & Station: normal Patient leans: N/A  Psychiatric Specialty Exam: Physical Exam  Nursing note and vitals reviewed. Constitutional: She is oriented to person, place, and time. She appears well-developed and well-nourished.  HENT:  Head: Normocephalic.  Neck: Normal range of motion.  Cardiovascular: Normal rate.  Musculoskeletal: Normal range of motion.  Neurological: She is alert and oriented to person, place, and time.  Psychiatric: Her speech is normal and behavior is normal. Judgment and thought content normal. Her mood appears anxious. Cognition and memory are normal.    Review of Systems  Psychiatric/Behavioral: Positive for substance abuse. The patient is nervous/anxious.   All other systems reviewed and are negative.   Blood pressure 139/75, pulse 76, temperature 98.5 F (36.9 C), temperature source Oral, resp. rate 16, height 5' (1.524 m), weight 70.8 kg (156 lb), SpO2 97 %.Body mass index is 30.47 kg/m.  General Appearance: Casual  Eye Contact:  Good  Speech:  Normal Rate  Volume:  Normal  Mood:  Anxious  Affect:  Congruent  Thought Process:  Coherent and Descriptions of Associations: Intact  Orientation:  Full (Time, Place, and Person)  Thought Content:  WDL and Logical  Suicidal Thoughts:  No  Homicidal Thoughts:  No  Memory:  Immediate;   Good Recent;   Good Remote;   Good  Judgement:  Fair  Insight:  Fair   Psychomotor Activity:  Normal  Concentration:  Concentration: Good and Attention Span: Good  Recall:  Good  Fund of Knowledge:  Good  Language:  Good  Akathisia:  No  Handed:  Right  AIMS (if indicated):     Assets:  Leisure Time Physical Health Resilience Social Support  ADL's:  Intact  Cognition:  WNL  Sleep:      Mental Status Per Nursing Assessment::   On Admission:   psych evaluation  Demographic Factors:  Age 31 or older and Caucasian  Loss Factors: NA  Historical Factors: NA  Risk Reduction Factors:   Sense of responsibility to family and Positive social support  Continued Clinical Symptoms:  Anxiety, mild  Cognitive Features That Contribute To Risk:  None    Suicide Risk:  Minimal: No identifiable suicidal ideation.  Patients presenting with no risk factors but with morbid ruminations; may be classified as minimal risk based on the severity of the depressive symptoms    Plan Of Care/Follow-up recommendations:  Activity:  as tolerated Diet:  heart healthy diet  Marion Rosenberry, NP 11/16/2017, 1:01 PM

## 2017-11-16 NOTE — Consult Note (Addendum)
[place] Psychiatry Consult   Reason for Consult:  Psych evaluation Referring Physician:  EDP Patient Identification: Carla Dunlap MRN:  [id number] Principal Diagnosis: Adjustment disorder with mixed anxiety and depressed mood Diagnosis:   Patient Active Problem List   Diagnosis Date Noted  . Adjustment disorder with mixed anxiety and depressed mood [F43.23] 11/16/2017    Priority: High  . Bloody diarrhea [R19.7] 08/25/2017  . GERD (gastroesophageal reflux disease) [K21.9] 08/25/2017    Total Time spent with patient: 45 minutes  Subjective:   Carla Dunlap is a 67 y.o. female patient does not warrant admission.  HPI:  68 yo female who presented to the ED via her POA for a psych evaluation and an assisted living placement.  She denies suicidal/homicidal ideations, hallucinations, and substance abuse but her POA feels she does abuse her medications (opiates and benzos) but negative on UDS.  Patient wants [place] to find her a place to live, social work consult placed.  No past suicide attempts. Stable for discharge.  Past Psychiatric History: none  Risk to Self: Suicidal Ideation: No Suicidal Intent: No Is patient at risk for suicide?: No Suicidal Plan?: No Access to Means: No Intentional Self Injurious Behavior: None Risk to Others: Homicidal Ideation: No Thoughts of Harm to Others: No Current Homicidal Intent: No Current Homicidal Plan: No Access to Homicidal Means: No History of harm to others?: No Assessment of Violence: None Noted Does patient have access to weapons?: No Criminal Charges Pending?: No Does patient have a court date: No Prior Inpatient Therapy: Prior Inpatient Therapy: Yes Prior Therapy Dates: December 2018 [facility] [place] Reason for Treatment: unknown Prior Outpatient Therapy: Prior Outpatient Therapy: Yes Prior Therapy Dates: 7 years ago Reason for Treatment: depression Does patient have an ACCT  team?: No Does patient have Intensive In-House Services?  : No Does patient have [facility] services? : No Does patient have P4CC services?: No  Past Medical History:  Past Medical History:  Diagnosis Date  . Back pain of lumbar region with sciatica   . Colon cancer ([place]) 2012  . Fibromyalgia   . Neuropathy     Past Surgical History:  Procedure Laterality Date  . ABDOMINAL HYSTERECTOMY  1999  . CESAREAN SECTION     x 2  . COLON SURGERY  2012  . COLONOSCOPY  2017   NO POLYPS  . EYE SURGERY     Family History:  Family History  Problem Relation Age of Onset  . Colon cancer Neg Hx   . Colon polyps Neg Hx    Family Psychiatric  History: none Social History:  Social History   Substance and Sexual Activity  Alcohol Use Never  . Frequency: Never     Social History   Substance and Sexual Activity  Drug Use Never    Social History   Socioeconomic History  . Marital status: Divorced    Spouse name: Not on file  . Number of children: Not on file  . Years of education: Not on file  . Highest education level: Not on file  Occupational History  . Not on file  Social Needs  . Financial resource strain: Not on file  . Food insecurity:    Worry: Not on file    Inability: Not on file  . Transportation needs:    Medical: Not on file    Non-medical: Not on file  Tobacco Use  . Smoking status: Current Every Day Smoker  Packs/day: 0.50  . Smokeless tobacco: Never Used  Substance and Sexual Activity  . Alcohol use: Never    Frequency: Never  . Drug use: Never  . Sexual activity: Not on file  Lifestyle  . Physical activity:    Days per week: Not on file    Minutes per session: Not on file  . Stress: Not on file  Relationships  . Social connections:    Talks on phone: Not on file    Gets together: Not on file    Attends religious service: Not on file    Active member of club or organization: Not on file    Attends meetings of clubs or organizations: Not on file     Relationship status: Not on file  Other Topics Concern  . Not on file  Social History Narrative  . Not on file   Additional Social History: N/A    Allergies:   Allergies  Allergen Reactions  . Anti-Inflammatory Enzyme [Nutritional Supplements]   . Aspirin   . Penicillins     Has patient had a PCN reaction causing immediate rash, facial/tongue/throat swelling, SOB or lightheadedness with hypotension: Yes Has patient had a PCN reaction causing severe rash involving mucus membranes or skin necrosis: Yes Has patient had a PCN reaction that required hospitalization: Yes Has patient had a PCN reaction occurring within the last 10 years: No If all of the above answers are "NO", then may proceed with Cephalosporin use.     Labs:  Results for orders placed or performed during the hospital encounter of 11/15/17 (from the past 48 hour(s))  Urine rapid drug screen (hosp performed)     Status: Abnormal   Collection Time: 11/15/17  3:20 PM  Result Value Ref Range   Opiates NONE DETECTED NONE DETECTED   Cocaine NONE DETECTED NONE DETECTED   Benzodiazepines NONE DETECTED NONE DETECTED   Amphetamines NONE DETECTED NONE DETECTED   Tetrahydrocannabinol NONE DETECTED NONE DETECTED   Barbiturates (A) NONE DETECTED    Result not available. Reagent lot number recalled by manufacturer.    Comment: Performed at [facility], [place] [address] [place], [place] [address]  Basic metabolic panel     Status: Abnormal   Collection Time: 11/15/17  3:56 PM  Result Value Ref Range   Sodium 139 135 - 145 mmol/L   Potassium 3.2 (L) 3.5 - 5.1 mmol/L   Chloride 102 98 - 111 mmol/L    Comment: Please note change in reference range.   CO2 26 22 - 32 mmol/L   Glucose, Bld 118 (H) 70 - 99 mg/dL    Comment: Please note change in reference range.   BUN 10 8 - 23 mg/dL    Comment: Please note change in reference range.   Creatinine, Ser 0.77 0.44 - 1.00 mg/dL   Calcium 9.3 8.9 - 10.3 mg/dL    GFR calc non Af Amer >60 >60 mL/min   GFR calc Af Amer >60 >60 mL/min    Comment: (NOTE) The eGFR has been calculated using the CKD EPI equation. This calculation has not been validated in all clinical situations. eGFR's persistently <60 mL/min signify possible Chronic Kidney Disease.    Anion gap 11 5 - 15    Comment: Performed at [facility], [place] [address], [place], [place] [address]  CBC with Differential     Status: Abnormal   Collection Time: 11/15/17  3:56 PM  Result Value Ref Range   WBC 10.0 4.0 -  10.5 K/uL   RBC 3.38 (L) 3.87 - 5.11 MIL/uL   Hemoglobin 12.8 12.0 - 15.0 g/dL   HCT 38.7 36.0 - 46.0 %   MCV 114.5 (H) 78.0 - 100.0 fL   MCH 37.9 (H) 26.0 - 34.0 pg   MCHC 33.1 30.0 - 36.0 g/dL   RDW 13.7 11.5 - 15.5 %   Platelets 267 150 - 400 K/uL   Neutrophils Relative % 85 %   Lymphocytes Relative 11 %   Monocytes Relative 4 %   Eosinophils Relative 0 %   Basophils Relative 0 %   Neutro Abs 8.5 (H) 1.7 - 7.7 K/uL   Lymphs Abs 1.1 0.7 - 4.0 K/uL   Monocytes Absolute 0.4 0.1 - 1.0 K/uL   Eosinophils Absolute 0.0 0.0 - 0.7 K/uL   Basophils Absolute 0.0 0.0 - 0.1 K/uL   Smear Review MORPHOLOGY UNREMARKABLE     Comment: Performed at [facility], [place] [address], [place], [place] [address]  Acetaminophen level     Status: Abnormal   Collection Time: 11/15/17  3:56 PM  Result Value Ref Range   Acetaminophen (Tylenol), Serum <10 (L) 10 - 30 ug/mL    Comment: (NOTE) Therapeutic concentrations vary significantly. A range of 10-30 ug/mL  may be an effective concentration for many patients. However, some  are best treated at concentrations outside of this range. Acetaminophen concentrations >150 ug/mL at 4 hours after ingestion  and >50 ug/mL at 12 hours after ingestion are often associated with  toxic reactions. Performed at [facility], [place] [address], [place], [place] [address]   Salicylate level     Status: None    Collection Time: 11/15/17  3:56 PM  Result Value Ref Range   Salicylate Lvl <0.9 2.8 - 30.0 mg/dL    Comment: Performed at [facility], [place] [address] [place], [place] [address]  Ethanol     Status: None   Collection Time: 11/15/17  3:56 PM  Result Value Ref Range   Alcohol, Ethyl (B) <10 <10 mg/dL    Comment: (NOTE) Lowest detectable limit for serum alcohol is 10 mg/dL. For medical purposes only. Performed at [facility], [place] [address], [place], [place] [address]     Current Facility-Administered Medications  Medication Dose Route Frequency Provider Last Rate Last Dose  . acetaminophen (TYLENOL) tablet 1,000 mg  1,000 mg Oral BID PRN Daleen Bo, MD      . diphenoxylate-atropine (LOMOTIL) 2.5-0.025 MG per tablet 1 tablet  1 tablet Oral QID PRN Daleen Bo, MD   1 tablet at 11/16/17 1005  . FLUoxetine (PROZAC) capsule 20 mg  20 mg Oral Daily Daleen Bo, MD   20 mg at 11/16/17 1005  . fluticasone (FLONASE) 50 MCG/ACT nasal spray 2 spray  2 spray Each Nare Daily Daleen Bo, MD   2 spray at 11/16/17 1005  . levothyroxine (SYNTHROID, LEVOTHROID) tablet 25 mcg  25 mcg Oral QAC breakfast Daleen Bo, MD   25 mcg at 11/16/17 0900  . prochlorperazine (COMPAZINE) tablet 10 mg  10 mg Oral Daily Virgel Manifold, MD   10 mg at 11/16/17 1223  . topiramate (TOPAMAX) tablet 100 mg  100 mg Oral BID Daleen Bo, MD   100 mg at 11/16/17 1005   Current Outpatient Medications  Medication Sig Dispense Refill  . acetaminophen (TYLENOL) 500 MG tablet Take 1,000 mg by mouth 2 (two) times daily as needed (break through pain).    Marland Kitchen  clonazePAM (KLONOPIN) 1 MG tablet 1 tablet during the day and 4 tablets at night    . cyclobenzaprine (FLEXERIL) 5 MG tablet Take 5 mg by mouth 3 (three) times daily.    . diphenoxylate-atropine (LOMOTIL) 2.5-0.025 MG tablet 1 PO IN THE AM AND QHS 60 tablet 0  . FLUoxetine (PROZAC) 20 MG capsule Take 20 mg by mouth  daily.    . fluticasone (FLONASE) 50 MCG/ACT nasal spray Place 2 sprays into both nostrils daily.    Marland Kitchen levothyroxine (SYNTHROID, LEVOTHROID) 25 MCG tablet Take 25 mcg by mouth daily before breakfast.    . Menthol-Methyl Salicylate (MUSCLE RUB) 10-15 % CREA Apply 1 application topically as needed for muscle pain.    Marland Kitchen oxyCODONE-acetaminophen (PERCOCET) 10-325 MG tablet Take 1 tablet by mouth 2 (two) times daily.    . prochlorperazine (COMPAZINE) 10 MG tablet Take 10 mg by mouth daily.    Marland Kitchen topiramate (TOPAMAX) 100 MG tablet Take 100 mg by mouth 2 (two) times daily.    Marland Kitchen omeprazole (PRILOSEC) 20 MG capsule 1 po every morning 30 minutes prior to your first meal. (Patient not taking: Reported on 11/15/2017) 31 capsule 11    Musculoskeletal: Strength & Muscle Tone: within normal limits Gait & Station: normal Patient leans: N/A  Psychiatric Specialty Exam: Physical Exam  Nursing note and vitals reviewed. Constitutional: She is oriented to person, place, and time. She appears well-developed and well-nourished.  HENT:  Head: Normocephalic and atraumatic.  Neck: Normal range of motion.  Cardiovascular: Normal rate.  Respiratory: Effort normal.  Musculoskeletal: Normal range of motion.  Neurological: She is alert and oriented to person, place, and time.  Psychiatric: Her speech is normal and behavior is normal. Judgment and thought content normal. Her mood appears anxious. Cognition and memory are normal.    Review of Systems  Psychiatric/Behavioral: Positive for substance abuse. The patient is nervous/anxious.   All other systems reviewed and are negative.   Blood pressure 139/75, pulse 76, temperature 98.5 F (36.9 C), temperature source Oral, resp. rate 16, height 5' (1.524 m), weight 70.8 kg (156 lb), SpO2 97 %.Body mass index is 30.47 kg/m.  General Appearance: Casual  Eye Contact:  Good  Speech:  Normal Rate  Volume:  Normal  Mood:  Anxious  Affect:  Congruent  Thought Process:   Coherent and Descriptions of Associations: Intact  Orientation:  Full (Time, Place, and Person)  Thought Content:  WDL and Logical  Suicidal Thoughts:  No  Homicidal Thoughts:  No  Memory:  Immediate;   Good Recent;   Good Remote;   Good  Judgement:  Fair  Insight:  Fair  Psychomotor Activity:  Normal  Concentration:  Concentration: Good and Attention Span: Good  Recall:  Good  Fund of Knowledge:  Good  Language:  Good  Akathisia:  No  Handed:  Right  AIMS (if indicated):   N/A  Assets:  Leisure Time Physical Health Resilience Social Support  ADL's:  Intact  Cognition:  WNL  Sleep:   N/A     Treatment Plan Summary: Adjustment disorder with depressed and anxious mood: -Restarted medical medications along with Prozac 20 mg daily for depression  Disposition: No evidence of imminent risk to self or others at present.    Waylan Boga, NP 11/16/2017 12:48 PM   Patient seen face-to-face for psychiatric evaluation, chart reviewed and case discussed with the physician extender and developed treatment plan. Reviewed the information documented and agree with the treatment plan.  Buford Dresser,  DO 11/17/17 2:09 AM

## 2017-11-19 ENCOUNTER — Encounter (HOSPITAL_COMMUNITY): Payer: Self-pay | Admitting: *Deleted

## 2017-11-19 ENCOUNTER — Emergency Department (HOSPITAL_COMMUNITY): Payer: Medicare Other

## 2017-11-19 ENCOUNTER — Other Ambulatory Visit: Payer: Self-pay

## 2017-11-19 ENCOUNTER — Emergency Department (HOSPITAL_COMMUNITY)
Admission: EM | Admit: 2017-11-19 | Discharge: 2017-11-19 | Disposition: A | Discharge status: Home / Self Care | Payer: Medicare Other | Attending: Emergency Medicine | Admitting: Emergency Medicine

## 2017-11-19 DIAGNOSIS — R569 Unspecified convulsions: Secondary | ICD-10-CM | POA: Diagnosis not present

## 2017-11-19 DIAGNOSIS — F17200 Nicotine dependence, unspecified, uncomplicated: Secondary | ICD-10-CM | POA: Diagnosis not present

## 2017-11-19 DIAGNOSIS — Z590 Homelessness: Secondary | ICD-10-CM | POA: Insufficient documentation

## 2017-11-19 DIAGNOSIS — R55 Syncope and collapse: Secondary | ICD-10-CM | POA: Diagnosis present

## 2017-11-19 DIAGNOSIS — Z79899 Other long term (current) drug therapy: Secondary | ICD-10-CM | POA: Diagnosis not present

## 2017-11-19 DIAGNOSIS — Z85038 Personal history of other malignant neoplasm of large intestine: Secondary | ICD-10-CM | POA: Insufficient documentation

## 2017-11-19 HISTORY — DX: Cardiac murmur, unspecified: R01.1

## 2017-11-19 HISTORY — DX: Unspecified convulsions: R56.9

## 2017-11-19 LAB — BASIC METABOLIC PANEL
ANION GAP: 11 (ref 5–15)
BUN: 8 mg/dL (ref 8–23)
CALCIUM: 8.5 mg/dL — AB (ref 8.9–10.3)
CO2: 20 mmol/L — AB (ref 22–32)
CREATININE: 0.82 mg/dL (ref 0.44–1.00)
Chloride: 108 mmol/L (ref 98–111)
GFR calc Af Amer: >60 mL/min (ref >60)
GLUCOSE: 119 mg/dL — AB (ref 70–99)
Potassium: 4.6 mmol/L (ref 3.5–5.1)
Sodium: 139 mmol/L (ref 135–145)

## 2017-11-19 LAB — CBC
HCT: 38.2 % (ref 36.0–46.0)
HEMOGLOBIN: 12.4 g/dL (ref 12.0–15.0)
MCH: 37.9 pg — AB (ref 26.0–34.0)
MCHC: 32.5 g/dL (ref 30.0–36.0)
MCV: 116.8 fL — ABNORMAL HIGH (ref 78.0–100.0)
PLATELETS: 160 10*3/uL (ref 150–400)
RBC: 3.27 MIL/uL — ABNORMAL LOW (ref 3.87–5.11)
RDW: 13.4 % (ref 11.5–15.5)
WBC: 7.8 10*3/uL (ref 4.0–10.5)

## 2017-11-19 LAB — I-STAT TROPONIN, ED: TROPONIN I, POC: 0 ng/mL (ref 0.00–0.08)

## 2017-11-19 LAB — CBG MONITORING, ED: GLUCOSE-CAPILLARY: 125 mg/dL — AB (ref 70–99)

## 2017-11-19 MED ORDER — DIPHENOXYLATE-ATROPINE 2.5-0.025 MG PO TABS: 1.0000 | ORAL_TABLET | Freq: Two times a day (BID) | ORAL | 0 refills | Status: DC

## 2017-11-19 MED ORDER — ACETAMINOPHEN 325 MG PO TABS
650.0000 mg | ORAL_TABLET | Freq: Once | ORAL | Status: AC
  Administered 2017-11-19: 650 mg via ORAL
  Filled 2017-11-19: qty 2

## 2017-11-19 MED ORDER — CYCLOBENZAPRINE HCL 5 MG PO TABS: 10.0000 mg | ORAL_TABLET | Freq: Two times a day (BID) | ORAL | 0 refills | Status: DC | PRN

## 2017-11-19 NOTE — ED Triage Notes (Signed)
Pt here via [facility] from [facility] after brief syncopal episode.  Does not know if she hit head.  States she was on her way to receive medical care b/c she felt she was going to have a seizure, even though she has been taking her topomax.  En-route via EMS pt stated she began having L sided chest pain.

## 2017-11-19 NOTE — ED Notes (Signed)
PT taking 100 mg tablet of personal topomax per Md.

## 2017-11-19 NOTE — ED Notes (Signed)
Pt ambulated without difficulty to wheelchair.

## 2017-11-19 NOTE — ED Notes (Signed)
Pt ambulated to beside commode stated she felt ok.

## 2017-11-19 NOTE — ED Provider Notes (Signed)
[place] EMERGENCY DEPARTMENT Provider Note   CSN: [id number] Arrival date & time: 11/19/17  0865     History   Chief Complaint Chief Complaint  Patient presents with  . Loss of Consciousness  . Chest Pain    HPI Carla Dunlap is a 67 y.o. female.  67 year old female with prior history as below presents with following reported seizure.  Patient reports that [facility] the [facility] shelter [facility].  She has not yet taken her Topamax because she did not have water to take it with.  She then had a seizure.  She now is without complaint.  She is not post ictal. She denies any specific injury.  She reports that her last seizure "was some time ago."  She denies current headache, chest pain, shortness of breath, nausea, vomiting, focal weakness, or other acute complaint.  Of note, she was just evaluated at [facility] ED earlier this week for a psychiatric screen and possible placement.  The history is provided by the patient.  Seizures   This is a recurrent problem. The current episode started 1 to 2 hours ago. The problem has been resolved. There was 1 seizure. The most recent episode lasted less than 30 seconds. unclear  Witnessed: unclear  There was the sensation of an aura present. The seizures did not continue in the ED. The seizure(s) had no focality (missed dose of topamax?). There has been no fever.    Past Medical History:  Diagnosis Date  . Back pain of lumbar region with sciatica   . Colon cancer ([place]) 2012  . Fibromyalgia   . Heart murmur   . Neuropathy   . Renal disorder    kidney stones  . Seizures ([place])    epilepsy    Patient Active Problem List   Diagnosis Date Noted  . Adjustment disorder with mixed anxiety and depressed mood 11/16/2017  . Bloody diarrhea 08/25/2017  . GERD (gastroesophageal reflux disease) 08/25/2017    Past Surgical History:  Procedure Laterality Date  . ABDOMINAL HYSTERECTOMY  1999  . CESAREAN SECTION     x 2  . COLON SURGERY  2012  . COLONOSCOPY  2017   NO POLYPS  . EYE SURGERY       OB History   None      Home Medications    Prior to Admission medications   Medication Sig Start Date End Date Taking? Authorizing Provider  acetaminophen (TYLENOL) 500 MG tablet Take 1,000 mg by mouth 2 (two) times daily as needed (break through pain).    [provider]  cyclobenzaprine (FLEXERIL) 5 MG tablet Take 5 mg by mouth 3 (three) times daily.    [provider]  diphenoxylate-atropine (LOMOTIL) 2.5-0.025 MG tablet 1 PO IN THE AM AND QHS 10/15/17   Fields, Sandi L, MD  FLUoxetine (PROZAC) 20 MG capsule Take 20 mg by mouth daily.    [provider]  fluticasone (FLONASE) 50 MCG/ACT nasal spray Place 2 sprays into both nostrils daily.    [provider]  levothyroxine (SYNTHROID, LEVOTHROID) 25 MCG tablet Take 25 mcg by mouth daily before breakfast.    [provider]  Menthol-Methyl Salicylate (MUSCLE RUB) 10-15 % CREA Apply 1 application topically as needed for muscle pain.    [provider]  omeprazole (PRILOSEC) 20 MG capsule 1 po every morning 30 minutes prior to your first meal. Patient not taking: Reported on 11/15/2017 08/25/17   Danie Binder, MD  prochlorperazine (COMPAZINE) 10 MG tablet Take 10 mg by mouth daily.    [provider]  topiramate (TOPAMAX) 100 MG tablet Take 100 mg by mouth 2 (two) times daily.    [provider]    Family History Family History  Problem Relation Age of Onset  . Colon cancer Neg Hx   . Colon polyps Neg Hx     Social History Social History   Tobacco Use  . Smoking status: Current Every Day Smoker    Packs/day: 0.50  . Smokeless tobacco: Never Used  Substance Use Topics  . Alcohol use: Never    Frequency: Never  . Drug use: Never     Allergies   Anti-inflammatory enzyme [nutritional supplements]; Aspirin; and Penicillins   Review of Systems Review of Systems    Neurological: Positive for seizures.  All other systems reviewed and are negative.    Physical Exam Updated Vital Signs BP 131/77   Pulse 99   Temp 99.2 F (37.3 C) (Oral)   Resp 18   Ht 5' (1.524 m)   Wt 70.8 kg (156 lb)   SpO2 94%   BMI 30.47 kg/m   Physical Exam  Constitutional: She is oriented to person, place, and time. She appears well-developed and well-nourished. No distress.  HENT:  Head: Normocephalic and atraumatic.  Mouth/Throat: Oropharynx is clear and moist.  Eyes: Pupils are equal, round, and reactive to light. Conjunctivae and EOM are normal.  Neck: Normal range of motion. Neck supple.  Cardiovascular: Normal rate, regular rhythm and normal heart sounds.  Pulmonary/Chest: Effort normal and breath sounds normal. No respiratory distress.  Abdominal: Soft. She exhibits no distension. There is no tenderness.  Musculoskeletal: Normal range of motion. She exhibits no edema or deformity.  Neurological: She is alert and oriented to person, place, and time.  AOx4 Normal speech  No facial droop 5 out of 5 strength to both upper and lower extremities  Skin: Skin is warm and dry.  Psychiatric: She has a normal mood and affect.  Nursing note and vitals reviewed.    ED Treatments / Results  Labs (all labs ordered are listed, but only abnormal results are displayed) Labs Reviewed  BASIC METABOLIC PANEL - Abnormal; Notable for the following components:      Result Value   CO2 20 (*)    Glucose, Bld 119 (*)    Calcium 8.5 (*)    All other components within normal limits  CBC - Abnormal; Notable for the following components:   RBC 3.27 (*)    MCV 116.8 (*)    MCH 37.9 (*)    All other components within normal limits  CBG MONITORING, ED - Abnormal; Notable for the following components:   Glucose-Capillary 125 (*)    All other components within normal limits  I-STAT TROPONIN, ED    EKG EKG Interpretation  Date/Time:  Friday November 19 2017 09:45:41  EDT Ventricular Rate:  99 PR Interval:    QRS Duration: 85 QT Interval:  357 QTC Calculation: 459 R Axis:   -11 Text Interpretation:  Sinus rhythm Anteroseptal infarct, old Confirmed by Dene Gentry [phone]) on 11/19/2017 9:48:09 AM Also confirmed by Dene Gentry [phone]), editor Philomena Doheny [phone])  on 11/19/2017 11:21:41 AM   Radiology No results found.  Procedures Procedures (including critical care time)  Medications Ordered in ED Medications - No data to display   Initial Impression / Assessment and Plan / ED Course  I have reviewed the triage vital signs  and the nursing notes.  Pertinent labs & imaging results that were available during my care of the patient were reviewed by me and considered in my medical decision making (see chart for details).     MDM  Screen complete  Patient is presenting for evaluation following possible seizure.  This appears to have been a breakthrough seizure.  Screening labs are without significant abnormality.  Following a period of observation in the ED patient feels improved and desires discharge.   Close follow-up is advised.  Strict return precautions are given and understood.    Patient did request refills on Flexeril and Lomotil.     Final Clinical Impressions(s) / ED Diagnoses   Final diagnoses:  Seizure [facility])    ED Discharge Orders        Ordered    cyclobenzaprine (FLEXERIL) 5 MG tablet  2 times daily PRN     11/19/17 1230    diphenoxylate-atropine (LOMOTIL) 2.5-0.025 MG tablet  2 times daily     11/19/17 1230       Valarie Merino, MD 11/19/17 1232

## 2017-11-19 NOTE — Discharge Instructions (Addendum)
Please return for any problem.  Follow up with a regular doctor as instructed.

## 2017-11-19 NOTE — ED Notes (Signed)
Patient transported to X-ray 

## 2017-12-22 ENCOUNTER — Encounter (HOSPITAL_COMMUNITY): Payer: Self-pay

## 2017-12-22 ENCOUNTER — Emergency Department (HOSPITAL_COMMUNITY)
Admission: EM | Admit: 2017-12-22 | Discharge: 2017-12-22 | Disposition: A | Discharge status: Home / Self Care | Payer: Medicare Other | Attending: Emergency Medicine | Admitting: Emergency Medicine

## 2017-12-22 DIAGNOSIS — Z87891 Personal history of nicotine dependence: Secondary | ICD-10-CM | POA: Diagnosis not present

## 2017-12-22 DIAGNOSIS — Z79899 Other long term (current) drug therapy: Secondary | ICD-10-CM | POA: Insufficient documentation

## 2017-12-22 DIAGNOSIS — N390 Urinary tract infection, site not specified: Secondary | ICD-10-CM | POA: Insufficient documentation

## 2017-12-22 DIAGNOSIS — K625 Hemorrhage of anus and rectum: Secondary | ICD-10-CM | POA: Diagnosis not present

## 2017-12-22 DIAGNOSIS — R195 Other fecal abnormalities: Secondary | ICD-10-CM | POA: Diagnosis present

## 2017-12-22 LAB — COMPREHENSIVE METABOLIC PANEL
ALBUMIN: 3.5 g/dL (ref 3.5–5.0)
ALK PHOS: 78 U/L (ref 38–126)
ALT: 35 U/L (ref 0–44)
ANION GAP: 9 (ref 5–15)
AST: 24 U/L (ref 15–41)
BILIRUBIN TOTAL: 0.5 mg/dL (ref 0.3–1.2)
BUN: 17 mg/dL (ref 8–23)
CALCIUM: 8.8 mg/dL — AB (ref 8.9–10.3)
CO2: 25 mmol/L (ref 22–32)
Chloride: 104 mmol/L (ref 98–111)
Creatinine, Ser: 0.74 mg/dL (ref 0.44–1.00)
Glucose, Bld: 114 mg/dL — ABNORMAL HIGH (ref 70–99)
POTASSIUM: 3.7 mmol/L (ref 3.5–5.1)
Sodium: 138 mmol/L (ref 135–145)
TOTAL PROTEIN: 6.7 g/dL (ref 6.5–8.1)

## 2017-12-22 LAB — CBC WITH DIFFERENTIAL/PLATELET
BAND NEUTROPHILS: 0 %
BLASTS: 0 %
Basophils Absolute: 0 10*3/uL (ref 0.0–0.1)
Basophils Relative: 0 %
Eosinophils Absolute: 0.1 10*3/uL (ref 0.0–0.7)
Eosinophils Relative: 1 %
HEMATOCRIT: 38.5 % (ref 36.0–46.0)
HEMOGLOBIN: 12.6 g/dL (ref 12.0–15.0)
Lymphocytes Relative: 17 %
Lymphs Abs: 1.4 10*3/uL (ref 0.7–4.0)
MCH: 37.6 pg — ABNORMAL HIGH (ref 26.0–34.0)
MCHC: 32.7 g/dL (ref 30.0–36.0)
MCV: 114.9 fL — AB (ref 78.0–100.0)
MONO ABS: 0.6 10*3/uL (ref 0.1–1.0)
Metamyelocytes Relative: 0 %
Monocytes Relative: 7 %
Myelocytes: 0 %
NEUTROS PCT: 75 %
Neutro Abs: 5.9 10*3/uL (ref 1.7–7.7)
OTHER: 0 %
PROMYELOCYTES RELATIVE: 0 %
Platelets: 346 10*3/uL (ref 150–400)
RBC: 3.35 MIL/uL — AB (ref 3.87–5.11)
RDW: 14 % (ref 11.5–15.5)
WBC: 8 10*3/uL (ref 4.0–10.5)
nRBC: 0 /100 WBC

## 2017-12-22 LAB — URINALYSIS, ROUTINE W REFLEX MICROSCOPIC
Bilirubin Urine: NEGATIVE
GLUCOSE, UA: NEGATIVE mg/dL
Ketones, ur: NEGATIVE mg/dL
NITRITE: POSITIVE — AB
PH: 6 (ref 5.0–8.0)
Protein, ur: NEGATIVE mg/dL
Specific Gravity, Urine: 1.008 (ref 1.005–1.030)

## 2017-12-22 LAB — TYPE AND SCREEN
ABO/RH(D): O POS
Antibody Screen: NEGATIVE

## 2017-12-22 LAB — POC OCCULT BLOOD, ED: Fecal Occult Bld: NEGATIVE

## 2017-12-22 LAB — PROTIME-INR
INR: 0.87
Prothrombin Time: 11.8 seconds (ref 11.4–15.2)

## 2017-12-22 MED ORDER — NITROFURANTOIN MONOHYD MACRO 100 MG PO CAPS: 100.0000 mg | ORAL_CAPSULE | Freq: Two times a day (BID) | ORAL | 0 refills | Status: DC

## 2017-12-22 MED ORDER — ACETAMINOPHEN 325 MG PO TABS
650.0000 mg | ORAL_TABLET | Freq: Once | ORAL | Status: AC
  Administered 2017-12-22: 650 mg via ORAL
  Filled 2017-12-22: qty 2

## 2017-12-22 NOTE — Discharge Instructions (Signed)
Follow-up with your GI doctor, take the antibiotics as prescribed for possible urinary tract infection

## 2017-12-22 NOTE — ED Provider Notes (Signed)
[facility] Provider Note   CSN: [id number] Arrival date & time: 12/22/17  1312     History   Chief Complaint Chief Complaint  Patient presents with  . Blood In Stools    HPI Carla Dunlap is a 67 y.o. female.  HPI Pt has a history of colon cancer s/p treatment.  Pt states she has had blood in her stool for the last several months but for the last couple of days she has noticed large amounts of blood.  Patient initially stated she has not seen a GI doctor since she moved from [place] to [place].  I did review the records with the patient indicating she had been seen in the GI doctor's office in June.  Patient forgot about that visit but has not seen them since.  She denies any fevers or chills.  No abdominal pain.  No syncopal episodes.  She denies any trouble with vaginal bleeding or urinary bleeding Past Medical History:  Diagnosis Date  . Back pain of lumbar region with sciatica   . Colon cancer ([facility]) 2012  . Fibromyalgia   . Heart murmur   . Neuropathy   . Renal disorder    kidney stones  . Seizures ([facility])    epilepsy    Patient Active Problem List   Diagnosis Date Noted  . Adjustment disorder with mixed anxiety and depressed mood 11/16/2017  . Bloody diarrhea 08/25/2017  . GERD (gastroesophageal reflux disease) 08/25/2017    Past Surgical History:  Procedure Laterality Date  . ABDOMINAL HYSTERECTOMY  1999  . CESAREAN SECTION     x 2  . COLON SURGERY  2012  . COLONOSCOPY  2017   NO POLYPS  . EYE SURGERY       OB History   None      Home Medications    Prior to Admission medications   Medication Sig Start Date End Date Taking? Authorizing Provider  acetaminophen (TYLENOL) 500 MG tablet Take 1,000 mg by mouth 2 (two) times daily as needed (break through pain).    [provider]  clonazePAM (KLONOPIN) 1 MG tablet Take 4 mg by mouth at bedtime.    [provider]  cyclobenzaprine (FLEXERIL) 5 MG tablet  Take 2 tablets (10 mg total) by mouth 2 (two) times daily as needed for muscle spasms. 11/19/17   Valarie Merino, MD  diphenoxylate-atropine (LOMOTIL) 2.5-0.025 MG tablet 1 PO IN THE AM AND QHS Patient taking differently: Take 1 tablet by mouth 2 (two) times daily.  10/15/17   Fields, Marga Melnick, MD  diphenoxylate-atropine (LOMOTIL) 2.5-0.025 MG tablet Take 1 tablet by mouth 2 (two) times daily. Take once in the morning and once in the evening as needed. 11/19/17   Valarie Merino, MD  FLUoxetine (PROZAC) 20 MG capsule Take 20 mg by mouth daily.    [provider]  fluticasone (FLONASE) 50 MCG/ACT nasal spray Place 2 sprays into both nostrils daily.    [provider]  levothyroxine (SYNTHROID, LEVOTHROID) 25 MCG tablet Take 25 mcg by mouth daily before breakfast.    [provider]  Menthol-Methyl Salicylate (MUSCLE RUB) 10-15 % CREA Apply 1 application topically as needed for muscle pain.    [provider]  nitrofurantoin, macrocrystal-monohydrate, (MACROBID) 100 MG capsule Take 1 capsule (100 mg total) by mouth 2 (two) times daily. 12/22/17   Dorie Rank, MD  omeprazole (PRILOSEC) 20 MG capsule 1 po every morning 30 minutes prior to your first  meal. Patient not taking: Reported on 11/15/2017 08/25/17   Danie Binder, MD  oxyCODONE-acetaminophen (PERCOCET) 10-325 MG tablet Take 1 tablet by mouth every 4 (four) hours as needed for pain.    [provider]  prochlorperazine (COMPAZINE) 10 MG tablet Take 10 mg by mouth daily.    [provider]  topiramate (TOPAMAX) 100 MG tablet Take 100 mg by mouth 2 (two) times daily.    [provider]    Family History Family History  Problem Relation Age of Onset  . Colon cancer Neg Hx   . Colon polyps Neg Hx     Social History Social History   Tobacco Use  . Smoking status: Former Smoker    Packs/day: 0.50    Last attempt to quit: 10/22/2017    Years since quitting: 0.1  . Smokeless tobacco:  Never Used  Substance Use Topics  . Alcohol use: Never    Frequency: Never  . Drug use: Never     Allergies   Anti-inflammatory enzyme [nutritional supplements]; Aspirin; and Penicillins   Review of Systems Review of Systems  All other systems reviewed and are negative.    Physical Exam Updated Vital Signs BP (!) 146/98   Pulse 80   Temp 98.4 F (36.9 C) (Oral)   Resp 18   Ht 1.549 m (5\' 1" )   Wt 66.7 kg   SpO2 96%   BMI 27.78 kg/m   Physical Exam  Constitutional: No distress.  HENT:  Head: Normocephalic and atraumatic.  Right Ear: External ear normal.  Left Ear: External ear normal.  Eyes: Conjunctivae are normal. Right eye exhibits no discharge. Left eye exhibits no discharge. No scleral icterus.  Neck: Neck supple. No tracheal deviation present.  Cardiovascular: Normal rate, regular rhythm and intact distal pulses.  Pulmonary/Chest: Effort normal and breath sounds normal. No stridor. No respiratory distress. She has no wheezes. She has no rales.  Abdominal: Soft. Bowel sounds are normal. She exhibits no distension. There is no tenderness. There is no rebound and no guarding.  Genitourinary:  Genitourinary Comments: She is noted to have some blood on a feminine hygiene pad on her underwear, no blood noted on rectal exam, no melena, no masses  Musculoskeletal: She exhibits no edema or tenderness.  Neurological: She is alert. She has normal strength. No cranial nerve deficit (no facial droop, extraocular movements intact, no slurred speech) or sensory deficit. She exhibits normal muscle tone. She displays no seizure activity. Coordination normal.  Skin: Skin is warm and dry. No rash noted. She is not diaphoretic.  Psychiatric: She has a normal mood and affect.  Nursing note and vitals reviewed.    ED Treatments / Results  Labs (all labs ordered are listed, but only abnormal results are displayed) Labs Reviewed  COMPREHENSIVE METABOLIC PANEL - Abnormal; Notable  for the following components:      Result Value   Glucose, Bld 114 (*)    Calcium 8.8 (*)    All other components within normal limits  CBC WITH DIFFERENTIAL/PLATELET - Abnormal; Notable for the following components:   RBC 3.35 (*)    MCV 114.9 (*)    MCH 37.6 (*)    All other components within normal limits  URINALYSIS, ROUTINE W REFLEX MICROSCOPIC - Abnormal; Notable for the following components:   Hgb urine dipstick SMALL (*)    Nitrite POSITIVE (*)    Leukocytes, UA SMALL (*)    Bacteria, UA RARE (*)    All other  components within normal limits  PROTIME-INR  POC OCCULT BLOOD, ED  POC OCCULT BLOOD, ED  TYPE AND SCREEN    EKG None  Radiology No results found.  Procedures Procedures (including critical care time)  Medications Ordered in ED Medications  acetaminophen (TYLENOL) tablet 650 mg (650 mg Oral Given 12/22/17 1605)     Initial Impression / Assessment and Plan / ED Course  I have reviewed the triage vital signs and the nursing notes.  Pertinent labs & imaging results that were available during my care of the patient were reviewed by me and considered in my medical decision making (see chart for details).  Clinical Course as of Dec 22 1636  Wed Dec 22, 2017  1632 Urinalysis does show positive nitrite and leukocyte esterase as well as white blood cells in the urine.   [EQ]  6834 lab tests are otherwise unremarkable.  She has no anemia and her stools are guaiac negative   [JK]    Clinical Course User Index [JK] Dorie Rank, MD    Patient presented to the emergency room for evaluation of rectal bleeding.  Patient did have some blood notable on her undergarment but she did not have any rectal bleeding on rectal exam.  Blood tests are normal.  No evidence to suggest any severe bleeding.  Upon reviewing the medical records, the patient has seen Dr. Oneida Alar here for the same complaint.  I do not think there is any indication of any active bleeding at this time.  I  think she is stable for outpatient follow-up.  Analysis does suggest the possibility urinary tract infection.  Patient will be given a prescription for Macrodantin.  Final Clinical Impressions(s) / ED Diagnoses   Final diagnoses:  Rectal bleeding  Urinary tract infection without hematuria, site unspecified    ED Discharge Orders         Ordered    nitrofurantoin, macrocrystal-monohydrate, (MACROBID) 100 MG capsule  2 times daily     12/22/17 1637           Dorie Rank, MD 12/22/17 [phone]

## 2017-12-22 NOTE — ED Notes (Signed)
Blood hemolyzed and had to be redrawn

## 2017-12-22 NOTE — ED Triage Notes (Signed)
Pt brought in from assisted living. Pt reports that she has stage3/4 colon cancer. Pt reports that she has seen blood for the last 2 months but for the last 2-3 days pt has had large amt red blood coming from rectum.

## 2017-12-22 NOTE — ED Notes (Signed)
Pt noted to have small amount of blood in pad. Blood is light red in color

## 2017-12-22 NOTE — ED Notes (Signed)
Received a call from [facility] employee .Nurse reports that pt has hx of abusing narcotics and klonopin. Hx of [facility] and was [facility]. Pt has been narcotic free for 6 weeks. Pt requesting percocet for abdominal pain. Spoke with Dr Tomi Bamberger ahd he ordered tylenol

## 2017-12-28 ENCOUNTER — Encounter: Payer: Self-pay | Admitting: Gastroenterology

## 2018-01-12 ENCOUNTER — Encounter

## 2018-01-12 ENCOUNTER — Ambulatory Visit: Payer: BLUE CROSS/BLUE SHIELD | Admitting: Gastroenterology

## 2018-01-20 ENCOUNTER — Ambulatory Visit: Payer: BLUE CROSS/BLUE SHIELD | Admitting: Nurse Practitioner

## 2018-01-26 ENCOUNTER — Ambulatory Visit: Payer: BLUE CROSS/BLUE SHIELD | Admitting: Gastroenterology

## 2018-01-27 ENCOUNTER — Ambulatory Visit (INDEPENDENT_AMBULATORY_CARE_PROVIDER_SITE_OTHER): Payer: Medicare Other | Admitting: Gastroenterology

## 2018-01-27 ENCOUNTER — Encounter: Payer: Self-pay | Admitting: Gastroenterology

## 2018-01-27 VITALS — BP 128/75 | HR 76 | Temp 97.5°F | Ht 61.0 in | Wt 164.2 lb

## 2018-01-27 DIAGNOSIS — R197 Diarrhea, unspecified: Secondary | ICD-10-CM | POA: Diagnosis not present

## 2018-01-27 DIAGNOSIS — C189 Malignant neoplasm of colon, unspecified: Secondary | ICD-10-CM

## 2018-01-27 DIAGNOSIS — K869 Disease of pancreas, unspecified: Secondary | ICD-10-CM | POA: Insufficient documentation

## 2018-01-27 NOTE — Assessment & Plan Note (Signed)
H/o colon cancer around 2012 s/p resection, chemotherapy and radiation. She complains of chronic diarrhea with brbpr for years since her treatment but will have periods of normalcy. Currently without symptoms for 2-3 weeks. We have requested records of last OV note with oncologist Loletta Parish, last TCS and path. Patient to call when recurrent symptoms. May require colonoscopy in near future. Further recommendations to follow.

## 2018-01-27 NOTE — Progress Notes (Addendum)
REVIEWED-NO ADDITIONAL RECOMMENDATIONS.   Primary Care Physician: [facility]  Primary Gastroenterologist:  Barney Drain, MD   Chief Complaint  Patient presents with  . Diarrhea    episode was 2 weeks ago  . Rectal Bleeding    2 weeks ago, bright red, happened daily x 3 months  . Nausea    no vomiting recently    HPI: Carla Dunlap is a 67 y.o. female here for follow-up.  We actually received referral request from the [facility], [facility] to establish GI care.  She was unaware that patient had already been to see [place] as patient had reported not seen a GI provider since moving to the area.  She was last seen by [place] in June.  We have been following her for chronic diarrhea with blood in stool. cdiff neg. Lactoferrin pos.   She had a CT of the abdomen pelvis with contrast back in May.  The body of the pancreas there was a 1.7 x 2.3 x 1.9 cm lesion.  Repeat imaging in 03/2018 recommended via MRI or CT pancreas protocol.  Nothing to explain symptoms.  She had diverticulosis but no diverticulitis.  Reports her last colonoscopy was a couple years ago while still living in [place].  She reports it was done at [facility] remember providers name who completed the colonoscopy.  Her oncologist was Loletta Parish, her surgeon was Dr. Lucia Gaskins.  She reports that she was told that she needed another colonoscopy in 5 years  Patient moved in to [facility] facility at the end of July.  Buford Dresser from the facility is here with her today.  He was a Software engineer by trade.  States that she has had chronic diarrhea with blood per rectum, at times will go several months with no symptoms.  She went to the ED latter part of August for the symptoms and was diagnosed with a UTI at the time.  Initially given Macrobid but did not tolerate.  Took 3-day course of Cipro based on urine culture sensitivities.  Since that time she is had no further vomiting, diarrhea, blood in the stool.   Her appetite has been fine.  Denies any heartburn.  Regular bowel movements at this point for the past 2 weeks.  Some nausea but compazine prn helps.   Current Outpatient Medications  Medication Sig Dispense Refill  . acetaminophen (TYLENOL) 325 MG tablet Take 1,000 mg by mouth every 6 (six) hours as needed (break through pain).     . clonazePAM (KLONOPIN) 1 MG tablet Take 0.5 mg by mouth daily as needed.     . cyclobenzaprine (FLEXERIL) 5 MG tablet Take 2 tablets (10 mg total) by mouth 2 (two) times daily as needed for muscle spasms. (Patient taking differently: Take 10 mg by mouth 3 (three) times daily as needed for muscle spasms. ) 20 tablet 0  . diphenoxylate-atropine (LOMOTIL) 2.5-0.025 MG tablet 1 PO IN THE AM AND QHS (Patient taking differently: Take 1 tablet by mouth 2 (two) times daily. ) 60 tablet 0  . FLUoxetine (PROZAC) 20 MG capsule Take 20 mg by mouth daily.    . fluticasone (FLONASE) 50 MCG/ACT nasal spray Place 2 sprays into both nostrils daily.    Marland Kitchen gabapentin (NEURONTIN) 300 MG capsule Take 300 mg by mouth 3 (three) times daily.    . hydrocortisone (ANUSOL-HC) 25 MG suppository Place 25 mg rectally 2 (two) times daily as needed for hemorrhoids or anal itching.    Marland Kitchen  hydroxyurea (HYDREA) 500 MG capsule Take 1,000 mg by mouth daily. May take with food to minimize GI side effects.    . hydrOXYzine (ATARAX/VISTARIL) 25 MG tablet Take 25 mg by mouth 3 (three) times daily as needed.    Marland Kitchen levothyroxine (SYNTHROID, LEVOTHROID) 25 MCG tablet Take 25 mcg by mouth daily before breakfast.    . lidocaine (LIDODERM) 5 % Place 1 patch onto the skin daily. Remove & Discard patch within 12 hours or as directed by MD    . Melatonin 3 MG TABS Take by mouth at bedtime.    Marland Kitchen oxyCODONE-acetaminophen (PERCOCET) 10-325 MG tablet Take 1 tablet by mouth every 4 (four) hours as needed for pain.    Marland Kitchen prochlorperazine (COMPAZINE) 10 MG tablet Take 10 mg by mouth 2 (two) times daily as needed.     Marland Kitchen  rOPINIRole (REQUIP) 1 MG tablet Take 1 mg by mouth at bedtime.    . topiramate (TOPAMAX) 100 MG tablet Take 100 mg by mouth 2 (two) times daily.    . traZODone (DESYREL) 50 MG tablet Take 50 mg by mouth at bedtime.     No current facility-administered medications for this visit.     Allergies as of 01/27/2018 - Review Complete 01/27/2018  Allergen Reaction Noted  . Anti-inflammatory enzyme [nutritional supplements]  08/15/2017  . Aspirin  08/15/2017  . Penicillins  08/15/2017   Past Medical History:  Diagnosis Date  . Back pain of lumbar region with sciatica   . Colon cancer ([place]) 2012  . Fibromyalgia   . Heart murmur   . Neuropathy   . Renal disorder    kidney stones  . Seizures ([place])    epilepsy   Past Surgical History:  Procedure Laterality Date  . ABDOMINAL HYSTERECTOMY  1999  . CESAREAN SECTION     x 2  . COLON SURGERY  2012  . COLONOSCOPY  2017   NO POLYPS  . EYE SURGERY      ROS:  General: Negative for anorexia, weight loss, fever, chills, fatigue, weakness. ENT: Negative for hoarseness, difficulty swallowing , nasal congestion. CV: Negative for chest pain, angina, palpitations, dyspnea on exertion, peripheral edema.  Respiratory: Negative for dyspnea at rest, dyspnea on exertion, cough, sputum, wheezing.  GI: See history of present illness. GU:  Negative for dysuria, hematuria, urinary incontinence, urinary frequency, nocturnal urination.  Endo: Negative for unusual weight change.    Physical Examination:   BP 128/75   Pulse 76   Temp (!) 97.5 F (36.4 C) (Oral)   Ht 5\' 1"  (1.549 m)   Wt 164 lb 3.2 oz (74.5 kg)   BMI 31.03 kg/m   General: Well-nourished, well-developed in no acute distress.  Eyes: No icterus. Mouth: Oropharyngeal mucosa moist and pink , no lesions erythema or exudate. Lungs: Clear to auscultation bilaterally.  Heart: Regular rate and rhythm, no murmurs rubs or gallops.  Abdomen: Bowel sounds are normal, nontender, nondistended, no  hepatosplenomegaly or masses, no abdominal bruits or hernia , no rebound or guarding.   Extremities: No lower extremity edema. No clubbing or deformities. Neuro: Alert and oriented x 4   Skin: Warm and dry, no jaundice.   Psych: Alert and cooperative, normal mood and affect.  Labs:  Lab Results  Component Value Date   CREATININE 0.74 12/22/2017   BUN 17 12/22/2017   NA 138 12/22/2017   K 3.7 12/22/2017   CL 104 12/22/2017   CO2 25 12/22/2017   Lab Results  Component Value  Date   ALT 35 12/22/2017   AST 24 12/22/2017   ALKPHOS 78 12/22/2017   BILITOT 0.5 12/22/2017   Lab Results  Component Value Date   WBC 8.0 12/22/2017   HGB 12.6 12/22/2017   HCT 38.5 12/22/2017   MCV 114.9 (H) 12/22/2017   PLT 346 12/22/2017   No results found for: FOLATE Lab Results  Component Value Date   VITAMINB12 660 10/19/2017    Imaging Studies: No results found.

## 2018-01-27 NOTE — Assessment & Plan Note (Signed)
Incidentally seen on CT in 09/2017. Will plan for MRI Abd/MRCP with and without IV gadolinium per radiologist recommendations (due in 03/2018). Patient agreeable.

## 2018-01-27 NOTE — Patient Instructions (Signed)
We will request your records for review.   Call with recurrent diarrhea and blood in stool.   You will need an MRI of your pancreas in November to follow up on tiny spot seen back in May on CT scan. We will call you and schedule when you are due.

## 2018-02-17 ENCOUNTER — Telehealth: Payer: Self-pay | Admitting: Gastroenterology

## 2018-02-17 NOTE — Telephone Encounter (Signed)
Recall for ct scan pancreatic protocol

## 2018-02-17 NOTE — Telephone Encounter (Signed)
Letter mailed

## 2018-02-22 ENCOUNTER — Telehealth: Payer: Self-pay | Admitting: Gastroenterology

## 2018-02-22 DIAGNOSIS — K869 Disease of pancreas, unspecified: Secondary | ICD-10-CM

## 2018-02-22 NOTE — Telephone Encounter (Signed)
I have refaxed the release again

## 2018-02-22 NOTE — Telephone Encounter (Signed)
Records never received, requested last colonoscopy from [facility] in [place] as well as last office note from her oncologist, Dr. Loletta Parish and her surgeon Dr. Lucia Gaskins.  These were requested at time of office visit back in September.  Can we please try again.

## 2018-03-16 NOTE — Telephone Encounter (Signed)
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    Perforated a sending colon cancer (stage III) with surgery, chemo, radiation, diagnosed in 2011.  Received 12 cycles of FOLFOX completed in September 2011.    CT scan in June 2017 showed small pancreatic cyst, pancreatic body, measuring 11 x 23 mm stable from 2015.    Sigmoidoscopy in April 2018 results unavailable but records indicated she needed to complete colonoscopy.      PLEASE LET PATIENT KNOW, WOULD RECOMMEND UPDATED COLONOSCOPY WITH PROPOFOL WITH [facility]. Dx: h/o colon cancer, rectal bleeding, diarrhea Hold imodium and lomotil for 5 days before bowel prep.  SHE ALSO NEEDS CT PANCREATIC PROTOCOL PER [facility] RECOMMENDATIONS. DOES NOT NEED MRI/MRCP TOO.

## 2018-03-17 ENCOUNTER — Other Ambulatory Visit: Payer: Self-pay

## 2018-03-17 ENCOUNTER — Encounter: Payer: Self-pay | Admitting: *Deleted

## 2018-03-17 DIAGNOSIS — R197 Diarrhea, unspecified: Secondary | ICD-10-CM

## 2018-03-17 DIAGNOSIS — K625 Hemorrhage of anus and rectum: Secondary | ICD-10-CM

## 2018-03-17 DIAGNOSIS — Z85038 Personal history of other malignant neoplasm of large intestine: Secondary | ICD-10-CM

## 2018-03-17 MED ORDER — NA SULFATE-K SULFATE-MG SULF 17.5-3.13-1.6 GM/177ML PO SOLN: 1.0000 | ORAL | 0 refills | Status: DC

## 2018-03-17 NOTE — Telephone Encounter (Signed)
CT abd is scheduled for 04/04/18, arrival time 3:45pm, npo 4 hrs prior, p/u oral contrast w/ instructions from [facility], needs BUN/CREAT done prior to scan.  Patient is at facility [facility]. I spoke with Sherren Mocha to schedule. He is aware of CT appt details. He asked that I fax the lab orders to them and they will take her for the lab work to be done. TCS is scheduled for 06/07/18 at 10:30am. He asked for Rx to be sent to [facility] (sent in) and we fax instructions to [phone]. I advised I will also fax the pre-op appt information.

## 2018-03-17 NOTE — Telephone Encounter (Signed)
Pre-op scheduled for 05/31/18 at 9:00am. I called [facility]. Spoke with Rip Harbour as Sherren Mocha is gone for the day. I provided her with the appt details. I have also faxed over prep instructions, pre-op appt letter, CT appt letter, lab orders. She is aware patient needs to have labs done at least 1 week prior to CT. She voiced understanding.

## 2018-03-17 NOTE — Telephone Encounter (Signed)
PT is aware. Ok to schedule the colonoscopy and CT.

## 2018-03-17 NOTE — Addendum Note (Signed)
Addended by: Inge Rise on: 03/17/2018 11:40 AM   Modules accepted: Orders

## 2018-03-17 NOTE — Addendum Note (Signed)
Addended by: Inge Rise on: 03/17/2018 11:16 AM   Modules accepted: Orders

## 2018-03-18 ENCOUNTER — Encounter: Payer: Self-pay | Admitting: Neurology

## 2018-03-18 ENCOUNTER — Other Ambulatory Visit: Payer: Self-pay

## 2018-03-18 ENCOUNTER — Ambulatory Visit (INDEPENDENT_AMBULATORY_CARE_PROVIDER_SITE_OTHER): Payer: Medicare Other | Admitting: Neurology

## 2018-03-18 DIAGNOSIS — M545 Low back pain, unspecified: Secondary | ICD-10-CM

## 2018-03-18 DIAGNOSIS — R269 Unspecified abnormalities of gait and mobility: Secondary | ICD-10-CM | POA: Diagnosis not present

## 2018-03-18 DIAGNOSIS — R569 Unspecified convulsions: Secondary | ICD-10-CM

## 2018-03-18 DIAGNOSIS — G8929 Other chronic pain: Secondary | ICD-10-CM

## 2018-03-18 DIAGNOSIS — G629 Polyneuropathy, unspecified: Secondary | ICD-10-CM

## 2018-03-18 DIAGNOSIS — M5441 Lumbago with sciatica, right side: Secondary | ICD-10-CM

## 2018-03-18 DIAGNOSIS — G619 Inflammatory polyneuropathy, unspecified: Secondary | ICD-10-CM

## 2018-03-18 DIAGNOSIS — M5442 Lumbago with sciatica, left side: Secondary | ICD-10-CM

## 2018-03-18 HISTORY — DX: Low back pain, unspecified: M54.50

## 2018-03-18 HISTORY — DX: Unspecified abnormalities of gait and mobility: R26.9

## 2018-03-18 HISTORY — DX: Other chronic pain: G89.29

## 2018-03-18 HISTORY — DX: Polyneuropathy, unspecified: G62.9

## 2018-03-18 MED ORDER — LEVETIRACETAM 750 MG PO TABS: 750.0000 mg | ORAL_TABLET | Freq: Two times a day (BID) | ORAL | 1 refills | Status: DC
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
## 2018-03-18 NOTE — Patient Instructions (Addendum)
With the Topamax, we will go on a slow taper:  For 3 weeks:  50 mg in am, 100 mg in pm  For 3 weeks: 50 mg twice a day  For 3 weeks: 50 mg at night  Then stop the medication.  When you run out of the Keppra 500 mg twice a day, start 750 mg twice a day.    May use Hemp oil for the low back pain.

## 2018-03-21 ENCOUNTER — Telehealth: Payer: Self-pay | Admitting: Neurology

## 2018-03-21 MED ORDER — TOPIRAMATE 50 MG PO TABS: ORAL_TABLET | ORAL | 0 refills | Status: DC

## 2018-03-21 NOTE — Addendum Note (Signed)
Addended by: France Ravens I on: 03/21/2018 11:03 AM   Modules accepted: Orders

## 2018-03-21 NOTE — Telephone Encounter (Signed)
Spoke with Eulas Post at [facility]. Pt. is tapering off of Topamax as rx'd at her 03/18/18 ov with [facility] but does not have enough med to complete taper. Verbal rx. given for Topamax 50mg  #57 tablets, 50mg  qam and 100mg  qhs for 19 days, then 50mg  bid for 21 days, then 50mg  qhs for 21 days.Hilton Cork

## 2018-03-21 NOTE — Telephone Encounter (Signed)
[facility] @  [place], [place] [phone] (Phone) [phone] (Fax)   Is asking for refill of topiramate (TOPAMAX) 100 MG tablet

## 2018-04-04 ENCOUNTER — Other Ambulatory Visit: Payer: Self-pay

## 2018-04-04 ENCOUNTER — Telehealth: Payer: Self-pay | Admitting: Gastroenterology

## 2018-04-04 ENCOUNTER — Ambulatory Visit (HOSPITAL_COMMUNITY)
Admission: RE | Admit: 2018-04-04 | Discharge: 2018-04-04 | Disposition: A | Discharge status: Home / Self Care | Payer: Medicare Other | Source: Ambulatory Visit | Attending: Gastroenterology | Admitting: Gastroenterology

## 2018-04-04 DIAGNOSIS — K869 Disease of pancreas, unspecified: Secondary | ICD-10-CM | POA: Insufficient documentation

## 2018-04-04 DIAGNOSIS — R7989 Other specified abnormal findings of blood chemistry: Secondary | ICD-10-CM

## 2018-04-04 LAB — POCT I-STAT CREATININE: CREATININE: 2 mg/dL — AB (ref 0.44–1.00)

## 2018-04-04 MED ORDER — IOPAMIDOL (ISOVUE-300) INJECTION 61%: 100.0000 mL | Freq: Once | INTRAVENOUS | Status: DC | PRN

## 2018-04-04 NOTE — Telephone Encounter (Signed)
Received call from CT. They are unable to complete CT with pancreatic protocol due to Creatinine 2.00.   This is a bump from her normal baseline from what I can tell. Need to check BMP tomorrow. I don't know of any medications that she is on that would be affecting her renal function. Please have this sent to PCP.  Routing to [place] who last saw patient. She will need follow-up of pancreatic lesion but renal issues need to be addressed first.

## 2018-04-04 NOTE — Telephone Encounter (Signed)
Called and spoke to [place], caregiver at facility. He is aware and will make sure pt gets her lab at [facility] tomorrow.

## 2018-04-05 NOTE — Progress Notes (Signed)
See separate phone note. Roseanne Kaufman, NP aware.

## 2018-04-06 LAB — BASIC METABOLIC PANEL
BUN/Creatinine Ratio: 11 — ABNORMAL LOW (ref 12–28)
BUN: 9 mg/dL (ref 8–27)
CO2: 24 mmol/L (ref 20–29)
Calcium: 9 mg/dL (ref 8.7–10.3)
Chloride: 99 mmol/L (ref 96–106)
Creatinine, Ser: 0.81 mg/dL (ref 0.57–1.00)
GFR, EST AFRICAN AMERICAN: 86 mL/min/{1.73_m2} (ref >59)
GFR, EST NON AFRICAN AMERICAN: 75 mL/min/{1.73_m2} (ref >59)
Glucose: 91 mg/dL (ref 65–99)
POTASSIUM: 3.9 mmol/L (ref 3.5–5.2)
SODIUM: 138 mmol/L (ref 134–144)

## 2018-04-06 NOTE — Telephone Encounter (Signed)
I called and spoke to [facility], caregiver. He is aware the CT will be rescheduled.

## 2018-04-06 NOTE — Telephone Encounter (Signed)
Called central scheduling numerous times and line has a fast busy signal. Wcb

## 2018-04-06 NOTE — Telephone Encounter (Signed)
So patient's creatinine is now entirely normal.  Wonder if [place] was erroneous.   Please reschedule her CT, please note this was ordered by [facility] so needs to have her as ordering provider when rescheduled.   FYI AB.

## 2018-04-06 NOTE — Telephone Encounter (Signed)
Called facility and spoke with [place]. Aware new CT appt is for 04/27/18 arrive at 3:45pm for 4:00pm appt, npo 4 hrs prior. Per [place] patient does not have any more contrast. She is aware to go to [facility] radiology and pick up oral contrast w/ instructions. She voiced understanding

## 2018-04-27 ENCOUNTER — Ambulatory Visit (HOSPITAL_COMMUNITY)
Admission: RE | Admit: 2018-04-27 | Discharge: 2018-04-27 | Disposition: A | Discharge status: Home / Self Care | Payer: Medicare Other | Source: Ambulatory Visit | Attending: Gastroenterology | Admitting: Gastroenterology

## 2018-04-27 DIAGNOSIS — K869 Disease of pancreas, unspecified: Secondary | ICD-10-CM | POA: Insufficient documentation

## 2018-04-27 MED ORDER — IOPAMIDOL (ISOVUE-300) INJECTION 61%
100.0000 mL | Freq: Once | INTRAVENOUS | Status: AC | PRN
  Administered 2018-04-27: 75 mL via INTRAVENOUS

## 2018-05-09 ENCOUNTER — Other Ambulatory Visit: Payer: Self-pay

## 2018-05-09 DIAGNOSIS — K869 Disease of pancreas, unspecified: Secondary | ICD-10-CM

## 2018-05-09 NOTE — Progress Notes (Signed)
CC'D TO PCP °

## 2018-05-09 NOTE — Progress Notes (Signed)
Forwarding to [place] for EUS appt.

## 2018-05-24 ENCOUNTER — Other Ambulatory Visit: Payer: Self-pay | Admitting: Gastroenterology

## 2018-05-24 DIAGNOSIS — K862 Cyst of pancreas: Secondary | ICD-10-CM

## 2018-05-28 ENCOUNTER — Other Ambulatory Visit: Payer: Self-pay

## 2018-05-28 ENCOUNTER — Emergency Department
Admission: EM | Admit: 2018-05-28 | Discharge: 2018-05-28 | Disposition: A | Discharge status: Home / Self Care | Payer: Medicare Other | Attending: Emergency Medicine | Admitting: Emergency Medicine

## 2018-05-28 ENCOUNTER — Ambulatory Visit
Admission: RE | Admit: 2018-05-28 | Discharge: 2018-05-28 | Disposition: A | Discharge status: Home / Self Care | Payer: Medicare Other | Source: Ambulatory Visit | Attending: Gastroenterology | Admitting: Gastroenterology

## 2018-05-28 ENCOUNTER — Encounter: Payer: Self-pay | Admitting: Emergency Medicine

## 2018-05-28 ENCOUNTER — Other Ambulatory Visit: Payer: Self-pay | Admitting: Gastroenterology

## 2018-05-28 DIAGNOSIS — Z87891 Personal history of nicotine dependence: Secondary | ICD-10-CM | POA: Insufficient documentation

## 2018-05-28 DIAGNOSIS — R899 Unspecified abnormal finding in specimens from other organs, systems and tissues: Secondary | ICD-10-CM | POA: Diagnosis not present

## 2018-05-28 DIAGNOSIS — G40909 Epilepsy, unspecified, not intractable, without status epilepticus: Secondary | ICD-10-CM | POA: Diagnosis not present

## 2018-05-28 DIAGNOSIS — Z79899 Other long term (current) drug therapy: Secondary | ICD-10-CM | POA: Insufficient documentation

## 2018-05-28 DIAGNOSIS — K862 Cyst of pancreas: Secondary | ICD-10-CM

## 2018-05-28 LAB — CBC WITH DIFFERENTIAL/PLATELET
Abs Immature Granulocytes: 0.02 10*3/uL (ref 0.00–0.07)
Basophils Absolute: 0 10*3/uL (ref 0.0–0.1)
Basophils Relative: 0 %
EOS PCT: 1 %
Eosinophils Absolute: 0.1 10*3/uL (ref 0.0–0.5)
HCT: 32.3 % — ABNORMAL LOW (ref 36.0–46.0)
Hemoglobin: 10.9 g/dL — ABNORMAL LOW (ref 12.0–15.0)
Immature Granulocytes: 0 %
Lymphocytes Relative: 28 %
Lymphs Abs: 1.4 10*3/uL (ref 0.7–4.0)
MCH: 39.6 pg — AB (ref 26.0–34.0)
MCHC: 33.7 g/dL (ref 30.0–36.0)
MCV: 117.5 fL — ABNORMAL HIGH (ref 80.0–100.0)
Monocytes Absolute: 0.5 10*3/uL (ref 0.1–1.0)
Monocytes Relative: 10 %
Neutro Abs: 2.8 10*3/uL (ref 1.7–7.7)
Neutrophils Relative %: 61 %
Platelets: 189 10*3/uL (ref 150–400)
RBC: 2.75 MIL/uL — ABNORMAL LOW (ref 3.87–5.11)
RDW: 13 % (ref 11.5–15.5)
WBC: 4.8 10*3/uL (ref 4.0–10.5)
nRBC: 0 % (ref 0.0–0.2)

## 2018-05-28 LAB — URINALYSIS, COMPLETE (UACMP) WITH MICROSCOPIC
Bacteria, UA: NONE SEEN
Bilirubin Urine: NEGATIVE
Glucose, UA: NEGATIVE mg/dL
HGB URINE DIPSTICK: NEGATIVE
Ketones, ur: NEGATIVE mg/dL
Leukocytes, UA: NEGATIVE
Nitrite: NEGATIVE
Protein, ur: NEGATIVE mg/dL
Specific Gravity, Urine: 1.008 (ref 1.005–1.030)
pH: 6 (ref 5.0–8.0)

## 2018-05-28 LAB — COMPREHENSIVE METABOLIC PANEL
ALT: 37 U/L (ref 0–44)
AST: 36 U/L (ref 15–41)
Albumin: 3.8 g/dL (ref 3.5–5.0)
Alkaline Phosphatase: 92 U/L (ref 38–126)
Anion gap: 8 (ref 5–15)
BUN: 12 mg/dL (ref 8–23)
CO2: 30 mmol/L (ref 22–32)
Calcium: 8.8 mg/dL — ABNORMAL LOW (ref 8.9–10.3)
Chloride: 102 mmol/L (ref 98–111)
Creatinine, Ser: 0.67 mg/dL (ref 0.44–1.00)
GFR calc non Af Amer: >60 mL/min (ref >60)
Glucose, Bld: 110 mg/dL — ABNORMAL HIGH (ref 70–99)
Potassium: 3.3 mmol/L — ABNORMAL LOW (ref 3.5–5.1)
Sodium: 140 mmol/L (ref 135–145)
Total Bilirubin: 0.5 mg/dL (ref 0.3–1.2)
Total Protein: 6.9 g/dL (ref 6.5–8.1)

## 2018-05-28 NOTE — ED Triage Notes (Signed)
Pt reports she went to have an MRI today due to spot on her pancreas had blood work done and showed creatinine level low, pt reports mild right flank discomfort not voiding as usual reports feeling dizzy at times and tired. Pt talks in complete sentences ambulatory to triage with steady gait

## 2018-05-28 NOTE — ED Provider Notes (Signed)
[facility] Emergency Department Provider Note    ____________________________________________   I have reviewed the triage vital signs and the nursing notes.   HISTORY  Chief Complaint Decreased renal function  History limited by: Not Limited   HPI Carla Dunlap is a 68 y.o. female who presents to the emergency department today because of concerns for decreased renal function.  The patient was at MRI today get an MRI of her pancreas however when they did lab work they told her that her kidney function was not appropriate for the study.  The patient states she has not had any history of any kidney dysfunction.  Her only complaint is of some new right flank plain and dizziness.  The symptoms have been going on for a few months.  She has noticed some darkening of her urine.  Patient also has chronic back pain.  Per medical record review patient has a history of chronic low back pain, kidney stones  Past Medical History:  Diagnosis Date  . Back pain of lumbar region with sciatica   . Chronic low back pain 03/18/2018  . Colon cancer ([place]) 2012  . Fibromyalgia   . Gait disturbance 03/18/2018  . Heart murmur   . Neuropathy   . Peripheral neuropathy 03/18/2018  . Renal disorder    kidney stones  . 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 Vision abnormalities     Patient Active Problem List   Diagnosis Date Noted  . Seizures ([place]) 03/18/2018  . Peripheral neuropathy 03/18/2018  . Gait disturbance 03/18/2018  . Chronic low back pain 03/18/2018  . Pancreatic lesion 01/27/2018  . Adjustment disorder with mixed anxiety and depressed mood 11/16/2017  . Bloody diarrhea 08/25/2017  . GERD (gastroesophageal reflux disease) 08/25/2017  . Colon cancer ([place]) 05/11/2010    Class: History of    Past Surgical History:  Procedure Laterality Date  . ABDOMINAL HYSTERECTOMY  1999  . CESAREAN SECTION     x 2  . COLON SURGERY  2012  . COLONOSCOPY  2017   NO POLYPS  . EYE  SURGERY      Prior to Admission medications   Medication Sig Start Date End Date Taking? Authorizing Provider  acetaminophen (TYLENOL) 650 MG CR tablet Take 650 mg by mouth every 8 (eight) hours as needed for pain.    [provider]  clonazePAM (KLONOPIN) 1 MG tablet Take 0.5 mg by mouth daily as needed for anxiety.     [provider]  cyclobenzaprine (FLEXERIL) 5 MG tablet Take 2 tablets (10 mg total) by mouth 2 (two) times daily as needed for muscle spasms. 11/19/17   Valarie Merino, MD  diclofenac sodium (VOLTAREN) 1 % GEL Apply 1 application topically every 6 (six) hours as needed (for pain).    [provider]  diphenoxylate-atropine (LOMOTIL) 2.5-0.025 MG tablet 1 PO IN THE AM AND QHS Patient taking differently: Take 1 tablet by mouth 2 (two) times daily.  10/15/17   Fields, Marga Melnick, MD  FLUoxetine (PROZAC) 20 MG capsule Take 20 mg by mouth daily.    [provider]  fluticasone (FLONASE) 50 MCG/ACT nasal spray Place 2 sprays into both nostrils daily.    [provider]  gabapentin (NEURONTIN) 400 MG capsule Take 400 mg by mouth 3 (three) times daily.    [provider]  hydrocortisone (ANUSOL-HC) 25 MG suppository Place 25 mg rectally 2 (two) times daily as needed for hemorrhoids or anal itching.  [provider]  hydroxyurea (HYDREA) 500 MG capsule Take 1,000 mg by mouth daily. May take with food to minimize GI side effects.    [provider]  hydrOXYzine (ATARAX/VISTARIL) 25 MG tablet Take 25 mg by mouth See admin instructions. Take 25 mg by mouth 3 times daily. Take 25 mg by mouth 3 times daily as needed for anxiety.    [provider]  levETIRAcetam (KEPPRA) 750 MG tablet Take 1 tablet (750 mg total) by mouth 2 (two) times daily. 03/18/18   Kathrynn Ducking, MD  levothyroxine (SYNTHROID, LEVOTHROID) 25 MCG tablet Take 25 mcg by mouth daily before breakfast.    [provider]  lidocaine  (LIDODERM) 5 % Place 1 patch onto the skin daily. Remove & Discard patch within 12 hours or as directed by MD    [provider]  Melatonin 3 MG TABS Take 3 mg by mouth at bedtime.     [provider]  Na Sulfate-K Sulfate-Mg Sulf (SUPREP BOWEL PREP KIT) 17.5-3.13-1.6 GM/177ML SOLN Take 1 kit by mouth as directed. 03/17/18   Fields, Marga Melnick, MD  nystatin (MYCOSTATIN) 100000 UNIT/ML suspension Take 5 mLs by mouth 2 (two) times daily.    [provider]  oxyCODONE-acetaminophen (PERCOCET) 10-325 MG tablet Take 1 tablet by mouth 2 (two) times daily as needed for pain.    [provider]  prochlorperazine (COMPAZINE) 10 MG tablet Take 10 mg by mouth 2 (two) times daily as needed for nausea.     [provider]  rOPINIRole (REQUIP) 2 MG tablet Take 2 mg by mouth at bedtime.     [provider]  topiramate (TOPAMAX) 50 MG tablet Topamax 59m qam and 1047mqhs for 19 days (already 2 days into taper) Topamax 5034mid for 21 days Topamax 82m61ms for 21 days Patient not taking: Reported on 05/19/2018 03/21/18   WillKathrynn Ducking  traZODone (DESYREL) 50 MG tablet Take 50 mg by mouth at bedtime.    [provider]    Allergies Anti-inflammatory enzyme [nutritional supplements]; Aspirin; Nsaids; and Penicillins  Family History  Problem Relation Age of Onset  . Scleroderma Mother   . Stroke Father   . Brain cancer Sister   . Obesity Brother   . Heart disease Brother   . Bone cancer Sister   . Colon cancer Neg Hx   . Colon polyps Neg Hx     Social History Social History   Tobacco Use  . Smoking status: Former Smoker    Packs/day: 0.50    Last attempt to quit: 10/22/2017    Years since quitting: 0.5  . Smokeless tobacco: Never Used  Substance Use Topics  . Alcohol use: Never    Frequency: Never  . Drug use: Never    Review of Systems Constitutional: No fever/chills Eyes: No visual changes. ENT: No sore  throat. Cardiovascular: Denies chest pain. Respiratory: Denies shortness of breath. Gastrointestinal: Positive for right flank pain. Genitourinary: Positive for darkening of urine Musculoskeletal: Positive for chronic back pain Skin: Negative for rash. Neurological: Negative for headaches, focal weakness or numbness.  ____________________________________________   PHYSICAL EXAM:  VITAL SIGNS: ED Triage Vitals [05/28/18 1728]  Enc Vitals Group     BP 132/72     Pulse Rate 87     Resp 20     Temp 98.4 F (36.9 C)     Temp Source Oral     SpO2 95 %     Weight  183 lb (83 kg)     Height 5' 1" (1.549 m)     Head Circumference      Peak Flow      Pain Score 0   Constitutional: Alert and oriented.  Eyes: Conjunctivae are normal.  ENT      Head: Normocephalic and atraumatic.      Nose: No congestion/rhinnorhea.      Mouth/Throat: Mucous membranes are moist.      Neck: No stridor. Hematological/Lymphatic/Immunilogical: No cervical lymphadenopathy. Cardiovascular: Normal rate, regular rhythm.  No murmurs, rubs, or gallops.  Respiratory: Normal respiratory effort without tachypnea nor retractions. Breath sounds are clear and equal bilaterally. No wheezes/rales/rhonchi. Gastrointestinal: Soft and non tender. No rebound. No guarding.  Genitourinary: Deferred Musculoskeletal: Normal range of motion in all extremities. No lower extremity edema. Neurologic:  Normal speech and language. No gross focal neurologic deficits are appreciated.  Skin:  Skin is warm, dry and intact. No rash noted. Psychiatric: Mood and affect are normal. Speech and behavior are normal. Patient exhibits appropriate insight and judgment.  ____________________________________________    LABS (pertinent positives/negatives)  CMP wnl except k 3.3, glu 110, ca 8.8 UA clear, unremarkable CBC wbc 4.8, hgb 10.9, plt  189  ____________________________________________   EKG  None  ____________________________________________    RADIOLOGY  None  ____________________________________________   PROCEDURES  Procedures  ____________________________________________   INITIAL IMPRESSION / ASSESSMENT AND PLAN / ED COURSE  Pertinent labs & imaging results that were available during my care of the patient were reviewed by me and considered in my medical decision making (see chart for details).   Patient presented to the emergency department today because of concerns for decreasing renal function that was found on lab work done prior to MRI.  Blood work here looks good.  Patient with a normal creatinine and GFR.  Unclear what the lab values were that were obtained earlier.  Will plan on discharging patient.  Discussed findings and results with patient.  ____________________________________________   FINAL CLINICAL IMPRESSION(S) / ED DIAGNOSES  Final diagnoses:  Abnormal laboratory test result     Note: This dictation was prepared with Dragon dictation. Any transcriptional errors that result from this process are unintentional     Nance Pear, MD 05/28/18 1845

## 2018-05-28 NOTE — Discharge Instructions (Addendum)
Please seek medical attention for any high fevers, chest pain, shortness of breath, change in behavior, persistent vomiting, bloody stool or any other new or concerning symptoms.  

## 2018-05-28 NOTE — ED Notes (Signed)
Patient denies pain and is resting comfortably.  

## 2018-05-31 ENCOUNTER — Encounter (HOSPITAL_COMMUNITY)
Admission: RE | Admit: 2018-05-31 | Discharge: 2018-05-31 | Disposition: A | Discharge status: Home / Self Care | Payer: Medicare Other | Source: Ambulatory Visit | Attending: Gastroenterology | Admitting: Gastroenterology

## 2018-05-31 ENCOUNTER — Encounter (HOSPITAL_COMMUNITY): Payer: Self-pay

## 2018-05-31 ENCOUNTER — Ambulatory Visit (HOSPITAL_COMMUNITY)
Admission: RE | Admit: 2018-05-31 | Discharge: 2018-05-31 | Disposition: A | Discharge status: Home / Self Care | Payer: Medicare Other | Source: Ambulatory Visit | Attending: Gastroenterology | Admitting: Gastroenterology

## 2018-05-31 ENCOUNTER — Other Ambulatory Visit: Payer: Self-pay

## 2018-05-31 DIAGNOSIS — R197 Diarrhea, unspecified: Secondary | ICD-10-CM | POA: Diagnosis not present

## 2018-05-31 HISTORY — DX: Personal history of urinary calculi: Z87.442

## 2018-06-03 ENCOUNTER — Other Ambulatory Visit: Payer: Self-pay | Admitting: Gastroenterology

## 2018-06-07 ENCOUNTER — Ambulatory Visit (HOSPITAL_COMMUNITY): Payer: Medicare Other | Admitting: Anesthesiology

## 2018-06-07 ENCOUNTER — Encounter (HOSPITAL_COMMUNITY)
Admission: RE | Disposition: A | Discharge status: Home / Self Care | Payer: Self-pay | Source: Home / Self Care | Attending: Gastroenterology

## 2018-06-07 ENCOUNTER — Ambulatory Visit (HOSPITAL_COMMUNITY)
Admission: RE | Admit: 2018-06-07 | Discharge: 2018-06-07 | Disposition: A | Discharge status: Home / Self Care | Payer: Medicare Other | Attending: Gastroenterology | Admitting: Gastroenterology

## 2018-06-07 ENCOUNTER — Encounter (HOSPITAL_COMMUNITY): Payer: Self-pay | Admitting: *Deleted

## 2018-06-07 DIAGNOSIS — Z6833 Body mass index (BMI) 33.0-33.9, adult: Secondary | ICD-10-CM | POA: Insufficient documentation

## 2018-06-07 DIAGNOSIS — Z79899 Other long term (current) drug therapy: Secondary | ICD-10-CM | POA: Diagnosis not present

## 2018-06-07 DIAGNOSIS — K921 Melena: Secondary | ICD-10-CM | POA: Insufficient documentation

## 2018-06-07 DIAGNOSIS — K5730 Diverticulosis of large intestine without perforation or abscess without bleeding: Secondary | ICD-10-CM | POA: Diagnosis not present

## 2018-06-07 DIAGNOSIS — Z7951 Long term (current) use of inhaled steroids: Secondary | ICD-10-CM | POA: Insufficient documentation

## 2018-06-07 DIAGNOSIS — Z980 Intestinal bypass and anastomosis status: Secondary | ICD-10-CM | POA: Insufficient documentation

## 2018-06-07 DIAGNOSIS — Z79890 Hormone replacement therapy (postmenopausal): Secondary | ICD-10-CM | POA: Insufficient documentation

## 2018-06-07 DIAGNOSIS — K625 Hemorrhage of anus and rectum: Secondary | ICD-10-CM

## 2018-06-07 DIAGNOSIS — Z9221 Personal history of antineoplastic chemotherapy: Secondary | ICD-10-CM | POA: Insufficient documentation

## 2018-06-07 DIAGNOSIS — K621 Rectal polyp: Secondary | ICD-10-CM | POA: Diagnosis not present

## 2018-06-07 DIAGNOSIS — R197 Diarrhea, unspecified: Secondary | ICD-10-CM

## 2018-06-07 DIAGNOSIS — Z85038 Personal history of other malignant neoplasm of large intestine: Secondary | ICD-10-CM | POA: Insufficient documentation

## 2018-06-07 DIAGNOSIS — G40909 Epilepsy, unspecified, not intractable, without status epilepticus: Secondary | ICD-10-CM | POA: Diagnosis not present

## 2018-06-07 DIAGNOSIS — Q438 Other specified congenital malformations of intestine: Secondary | ICD-10-CM | POA: Diagnosis not present

## 2018-06-07 DIAGNOSIS — K648 Other hemorrhoids: Secondary | ICD-10-CM | POA: Diagnosis not present

## 2018-06-07 DIAGNOSIS — G629 Polyneuropathy, unspecified: Secondary | ICD-10-CM | POA: Insufficient documentation

## 2018-06-07 DIAGNOSIS — K6289 Other specified diseases of anus and rectum: Secondary | ICD-10-CM

## 2018-06-07 DIAGNOSIS — E669 Obesity, unspecified: Secondary | ICD-10-CM | POA: Diagnosis not present

## 2018-06-07 DIAGNOSIS — Z87891 Personal history of nicotine dependence: Secondary | ICD-10-CM | POA: Insufficient documentation

## 2018-06-07 DIAGNOSIS — D124 Benign neoplasm of descending colon: Secondary | ICD-10-CM | POA: Insufficient documentation

## 2018-06-07 DIAGNOSIS — D125 Benign neoplasm of sigmoid colon: Secondary | ICD-10-CM | POA: Diagnosis not present

## 2018-06-07 HISTORY — PX: POLYPECTOMY: SHX5525

## 2018-06-07 HISTORY — PX: BIOPSY: SHX5522

## 2018-06-07 HISTORY — PX: COLONOSCOPY WITH PROPOFOL: SHX5780

## 2018-06-07 SURGERY — COLONOSCOPY WITH PROPOFOL
Anesthesia: Monitor Anesthesia Care

## 2018-06-07 MED ORDER — PROPOFOL 10 MG/ML IV BOLUS
INTRAVENOUS | Status: DC | PRN
  Administered 2018-06-07 (×2): 20 mg via INTRAVENOUS

## 2018-06-07 MED ORDER — CHLORHEXIDINE GLUCONATE CLOTH 2 % EX PADS: 6.0000 | MEDICATED_PAD | Freq: Once | CUTANEOUS | Status: DC

## 2018-06-07 MED ORDER — SPOT INK MARKER SYRINGE KIT
PACK | SUBMUCOSAL | Status: AC
  Filled 2018-06-07: qty 5

## 2018-06-07 MED ORDER — LACTATED RINGERS IV SOLN: INTRAVENOUS | Status: DC

## 2018-06-07 MED ORDER — SPOT INK MARKER SYRINGE KIT
PACK | SUBMUCOSAL | Status: DC | PRN
  Administered 2018-06-07: 1 mL via SUBMUCOSAL

## 2018-06-07 MED ORDER — HYDROMORPHONE HCL 1 MG/ML IJ SOLN
0.2500 mg | INTRAMUSCULAR | Status: DC | PRN
  Administered 2018-06-07 (×2): 0.5 mg via INTRAVENOUS
  Filled 2018-06-07 (×2): qty 0.5

## 2018-06-07 MED ORDER — PROPOFOL 500 MG/50ML IV EMUL
INTRAVENOUS | Status: DC | PRN
  Administered 2018-06-07: 150 ug/kg/min via INTRAVENOUS
  Administered 2018-06-07: 11:00:00 via INTRAVENOUS

## 2018-06-07 MED ORDER — HYDROCODONE-ACETAMINOPHEN 7.5-325 MG PO TABS: 1.0000 | ORAL_TABLET | Freq: Once | ORAL | Status: DC | PRN

## 2018-06-07 MED ORDER — PROMETHAZINE HCL 25 MG/ML IJ SOLN: 6.2500 mg | INTRAMUSCULAR | Status: DC | PRN

## 2018-06-07 MED ORDER — MEPERIDINE HCL 100 MG/ML IJ SOLN: 6.2500 mg | INTRAMUSCULAR | Status: DC | PRN

## 2018-06-07 MED ORDER — LACTATED RINGERS IV SOLN
INTRAVENOUS | Status: DC
  Administered 2018-06-07: 10:00:00 via INTRAVENOUS

## 2018-06-07 NOTE — Discharge Instructions (Signed)
Your RECTAL BLEEDING IS MOST LIKELY DUE TO RECTAL PROLAPSE AND small internal hemorrhoids. You have diverticulosis IN YOUR LEFT COLON. YOU HAD TWO POLYPS REMOVED. ONE WAS LARGER AND I TATTOOED THE BASE.    DRINK WATER TO KEEP YOUR URINE LIGHT YELLOW.  CONTINUE YOUR WEIGHT LOSS EFFORTS. YOUR BODY MASS INDEX IS OVER 30 WHICH MEANS YOU ARE OBESE. OBESITY IS ASSOCIATED WITH AN INCREASE FOR ALL CANCERS, INCLUDING ESOPHAGEAL AND COLON CANCER. A WEIGHT OF 155 LBS OR LESS  WILL GET YOUR BODY MASS INDEX(BMI) UNDER 30.  FOLLOW A HIGH FIBER DIET. AVOID ITEMS THAT CAUSE BLOATING. See info below.   USE PREPARATION H FOUR TIMES  A DAY IF NEEDED TO RELIEVE RECTAL PAIN/PRESSURE/BLEEDING.  SEE [facility] TO ADDRESS THE RECTAL PROLAPSE.   YOUR BIOPSY RESULTS WILL BE BACK IN 5 BUSINESS DAYS.  FOLLOW UP IN 4 MOS.   Next colonoscopy in 5 years.  Colonoscopy Care After Read the instructions outlined below and refer to this sheet in the next week. These discharge instructions provide you with general information on caring for yourself after you leave the hospital. While your treatment has been planned according to the most current medical practices available, unavoidable complications occasionally occur. If you have any problems or questions after discharge, call DR. Stefan Karen, [phone].  ACTIVITY  You may resume your regular activity, but move at a slower pace for the next 24 hours.   Take frequent rest periods for the next 24 hours.   Walking will help get rid of the air and reduce the bloated feeling in your belly (abdomen).   No driving for 24 hours (because of the medicine (anesthesia) used during the test).   You may shower.   Do not sign any important legal documents or operate any machinery for 24 hours (because of the anesthesia used during the test).    NUTRITION  Drink plenty of fluids.   You may resume your normal diet as instructed by your doctor.   Begin with a light  meal and progress to your normal diet. Heavy or fried foods are harder to digest and may make you feel sick to your stomach (nauseated).   Avoid alcoholic beverages for 24 hours or as instructed.    MEDICATIONS  You may resume your normal medications.   WHAT YOU CAN EXPECT TODAY  Some feelings of bloating in the abdomen.   Passage of more gas than usual.   Spotting of blood in your stool or on the toilet paper  .  IF YOU HAD POLYPS REMOVED DURING THE COLONOSCOPY:  Eat a soft diet IF YOU HAVE NAUSEA, BLOATING, ABDOMINAL PAIN, OR VOMITING.    FINDING OUT THE RESULTS OF YOUR TEST Not all test results are available during your visit. DR. Oneida Alar WILL CALL YOU WITHIN 14 DAYS OF YOUR PROCEDUE WITH YOUR RESULTS. Do not assume everything is normal if you have not heard from DR. Shaine Mount, CALL HER OFFICE AT [phone].  SEEK IMMEDIATE MEDICAL ATTENTION AND CALL THE OFFICE: [phone] IF:  You have more than a spotting of blood in your stool.   Your belly is swollen (abdominal distention).   You are nauseated or vomiting.   You have a temperature over 101F.   You have abdominal pain or discomfort that is severe or gets worse throughout the day.  High-Fiber Diet A high-fiber diet changes your normal diet to include more whole grains, legumes, fruits, and vegetables. Changes in the diet involve replacing refined carbohydrates with unrefined  foods. The calorie level of the diet is essentially unchanged. The Dietary Reference Intake (recommended amount) for adult males is 38 grams per day. For adult females, it is 25 grams per day. Pregnant and lactating women should consume 28 grams of fiber per day. Fiber is the intact part of a plant that is not broken down during digestion. Functional fiber is fiber that has been isolated from the plant to provide a beneficial effect in the body.  PURPOSE  Increase stool bulk.   Ease and regulate bowel movements.   Lower cholesterol.   REDUCE  RISK OF COLON CANCER  INDICATIONS THAT YOU NEED MORE FIBER  Constipation and hemorrhoids.   Uncomplicated diverticulosis (intestine condition) and irritable bowel syndrome.   Weight management.   As a protective measure against hardening of the arteries (atherosclerosis), diabetes, and cancer.   GUIDELINES FOR INCREASING FIBER IN THE DIET  Start adding fiber to the diet slowly. A gradual increase of about 5 more grams (2 slices of whole-wheat bread, 2 servings of most fruits or vegetables, or 1 bowl of high-fiber cereal) per day is best. Too rapid an increase in fiber may result in constipation, flatulence, and bloating.   Drink enough water and fluids to keep your urine clear or pale yellow. Water, juice, or caffeine-free drinks are recommended. Not drinking enough fluid may cause constipation.   Eat a variety of high-fiber foods rather than one type of fiber.   Try to increase your intake of fiber through using high-fiber foods rather than fiber pills or supplements that contain small amounts of fiber.   The goal is to change the types of food eaten. Do not supplement your present diet with high-fiber foods, but replace foods in your present diet.   INCLUDE A VARIETY OF FIBER SOURCES  Replace refined and processed grains with whole grains, canned fruits with fresh fruits, and incorporate other fiber sources. White rice, white breads, and most bakery goods contain little or no fiber.   Brown whole-grain rice, buckwheat oats, and many fruits and vegetables are all good sources of fiber. These include: broccoli, Brussels sprouts, cabbage, cauliflower, beets, sweet potatoes, white potatoes (skin on), carrots, tomatoes, eggplant, squash, berries, fresh fruits, and dried fruits.   Cereals appear to be the richest source of fiber. Cereal fiber is found in whole grains and bran. Bran is the fiber-rich outer coat of cereal grain, which is largely removed in refining. In whole-grain cereals,  the bran remains. In breakfast cereals, the largest amount of fiber is found in those with "bran" in their names. The fiber content is sometimes indicated on the label.   You may need to include additional fruits and vegetables each day.   In baking, for 1 cup white flour, you may use the following substitutions:   1 cup whole-wheat flour minus 2 tablespoons.   1/2 cup white flour plus 1/2 cup whole-wheat flour.   Polyps, Colon  A polyp is extra tissue that grows inside your body. Colon polyps grow in the large intestine. The large intestine, also called the colon, is part of your digestive system. It is a long, hollow tube at the end of your digestive tract where your body makes and stores stool. Most polyps are not dangerous. They are benign. This means they are not cancerous. But over time, some types of polyps can turn into cancer. Polyps that are smaller than a pea are usually not harmful. But larger polyps could someday become or may already be cancerous.  To be safe, doctors remove all polyps and test them.   PREVENTION There is not one sure way to prevent polyps. You might be able to lower your risk of getting them if you:  Eat more fruits and vegetables and less fatty food.   Do not smoke.   Avoid alcohol.   Exercise every day.   Lose weight if you are overweight.   Eating more calcium and folate can also lower your risk of getting polyps. Some foods that are rich in calcium are milk, cheese, and broccoli. Some foods that are rich in folate are chickpeas, kidney beans, and spinach.    Diverticulosis Diverticulosis is a common condition that develops when small pouches (diverticula) form in the wall of the colon. The risk of diverticulosis increases with age. It happens more often in people who eat a low-fiber diet. Most individuals with diverticulosis have no symptoms. Those individuals with symptoms usually experience belly (abdominal) pain, constipation, or loose stools  (diarrhea).  HOME CARE INSTRUCTIONS  Increase the amount of fiber in your diet as directed by your caregiver or dietician. This may reduce symptoms of diverticulosis.   Drink at least 6 to 8 glasses of water each day to prevent constipation.   Try not to strain when you have a bowel movement.   Avoiding nuts and seeds to prevent complications is NOT NECESSARY.   FOODS HAVING HIGH FIBER CONTENT INCLUDE:  Fruits. Apple, peach, pear, tangerine, raisins, prunes.   Vegetables. Brussels sprouts, asparagus, broccoli, cabbage, carrot, cauliflower, romaine lettuce, spinach, summer squash, tomato, winter squash, zucchini.   Starchy Vegetables. Baked beans, kidney beans, lima beans, split peas, lentils, potatoes (with skin).   Grains. Whole wheat bread, brown rice, bran flake cereal, plain oatmeal, white rice, shredded wheat, bran muffins.   SEEK IMMEDIATE MEDICAL CARE IF:  You develop increasing pain or severe bloating.   You have an oral temperature above 101F.   You develop vomiting or bowel movements that are bloody or black.

## 2018-06-07 NOTE — Anesthesia Preprocedure Evaluation (Signed)
Anesthesia Evaluation    Airway Mallampati: II   Neck ROM: full    Dental  (+) Edentulous Lower, Edentulous Upper   Pulmonary former smoker,     + decreased breath sounds      Cardiovascular + Valvular Problems/Murmurs  Rhythm:regular     Neuro/Psych Seizures -,   Neuromuscular disease    GI/Hepatic GERD  ,  Endo/Other    Renal/GU      Musculoskeletal   Abdominal   Peds  Hematology   Anesthesia Other Findings Obesity, minimally ambulatory H/O colon CA s/p chemo 8 yrs ago with resultant neuropathy.  New onset bloody stools, diarrhea SZ hx after fall.  Last sz was ten yrs ago  Reproductive/Obstetrics                             Anesthesia Physical Anesthesia Plan  ASA: IV  Anesthesia Plan: MAC   Post-op Pain Management:    Induction:   PONV Risk Score and Plan:   Airway Management Planned:   Additional Equipment:   Intra-op Plan:   Post-operative Plan:   Informed Consent: I have reviewed the patients History and Physical, chart, labs and discussed the procedure including the risks, benefits and alternatives for the proposed anesthesia with the patient or authorized representative who has indicated his/her understanding and acceptance.       Plan Discussed with: Anesthesiologist  Anesthesia Plan Comments:         Anesthesia Quick Evaluation

## 2018-06-07 NOTE — Transfer of Care (Signed)
Immediate Anesthesia Transfer of Care Note  Patient: Carla Dunlap  Procedure(s) Performed: COLONOSCOPY WITH PROPOFOL (N/A ) BIOPSY POLYPECTOMY  Patient Location: PACU  Anesthesia Type:MAC  Level of Consciousness: awake and patient cooperative  Airway & Oxygen Therapy: Patient Spontanous Breathing  Post-op Assessment: Report given to RN, Post -op Vital signs reviewed and stable and Patient moving all extremities  Post vital signs: Reviewed and stable  Last Vitals:  Vitals Value Taken Time  BP    Temp    Pulse 82 06/07/2018 11:15 AM  Resp 21 06/07/2018 11:15 AM  SpO2 97 % 06/07/2018 11:15 AM  Vitals shown include unvalidated device data.  Last Pain:  Vitals:   06/07/18 1033  TempSrc:   PainSc: 8          Complications: No apparent anesthesia complications

## 2018-06-07 NOTE — Op Note (Signed)
[facility] Patient Name: Carla Dunlap Procedure Date: 06/07/2018 10:19 AM MRN: [id number] Date of Birth: 1950/10/24 Attending MD: Barney Drain MD, MD CSN: [id number] Age: 68 Admit Type: Outpatient Procedure:                Colonoscopy WITH COLD SNARE/SNARE CAUTERY                            POLYPECTOMY/TATTOO Indications:              Clinically significant diarrhea of unexplained                            origin, Hematochezia Providers:                Barney Drain MD, MD, Otis Peak B. Sharon Seller, RN, Nelma Rothman, Technician Referring MD:             Denyce Robert Medicines:                Propofol per Anesthesia Complications:            No immediate complications. Estimated Blood Loss:     Estimated blood loss was minimal. Procedure:                Pre-Anesthesia Assessment:                           - Prior to the procedure, a History and Physical                            was performed, and patient medications and                            allergies were reviewed. The patient's tolerance of                            previous anesthesia was also reviewed. The risks                            and benefits of the procedure and the sedation                            options and risks were discussed with the patient.                            All questions were answered, and informed consent                            was obtained. Prior Anticoagulants: The patient has                            taken no previous anticoagulant or antiplatelet                            agents. ASA Grade  Assessment: II - A patient with                            mild systemic disease. After reviewing the risks                            and benefits, the patient was deemed in                            satisfactory condition to undergo the procedure.                            After obtaining informed consent, the colonoscope                            was passed under  direct vision. Throughout the                            procedure, the patient's blood pressure, pulse, and                            oxygen saturations were monitored continuously. The                            [id number]-[id number] ([id number]) scope was introduced through                            the anus and advanced to the the ileocolonic                            anastomosis. The colonoscopy was somewhat difficult                            due to a tortuous colon. Successful completion of                            the procedure was aided by straightening and                            shortening the scope to obtain bowel loop reduction                            and COLOWRAP. The patient tolerated the procedure                            well. The quality of the bowel preparation was                            excellent. The rectum and ANASTOMOSIS AND RECTUM                            were photographed. Scope In: 10:42:50 AM Scope Out: 88:91:69 AM Scope Withdrawal Time: 0 hours 19 minutes 23 seconds  Total Procedure Duration:  0 hours 23 minutes 11 seconds  Findings:      ANASTOMOSIS NORMAL.      The recto-sigmoid colon, sigmoid colon and descending colon were       moderately redundant. Biopsies for histology were taken with a cold       forceps from the transverse colon, descending colon and sigmoid colon       for evaluation of microscopic colitis.      Multiple small and large-mouthed diverticula were found in the       recto-sigmoid colon and sigmoid colon.      A 4 mm polyp was found in the descending colon(BTL 2). The polyp was       sessile. The polyp was removed with a cold snare. Resection and       retrieval were complete.      A 13 mm polyp was found in the sigmoid colon(BTL 2). The polyp was       pedunculated. The polyp was removed with a hot snare. Resection and       retrieval were complete. Area was tattooed with an injection of 1 mL of       Spot (carbon black).       Localized mild inflammation characterized by congestion (edema),       erosions and erythema was found in the rectum. This was biopsied with a       cold forceps for histology.      Internal hemorrhoids were found. The hemorrhoids were small. Impression:               - Redundant LEFT colon.                           - MODERATE Diverticulosis in the recto-sigmoid                            colon and in the sigmoid colon.                           - One 4 mm polyp in the descending colon, removed                            with a cold snare. Resected and retrieved.                           - One 13 mm polyp in the sigmoid colon, removed                            with a hot snare. Resected and retrieved. Tattooed.                           - RECTAL BLEEDING DUE TO PROCTICTIS/HEMORRHOIRDS Moderate Sedation:      Per Anesthesia Care Recommendation:           - Patient has a contact number available for                            emergencies. The signs and symptoms of potential  delayed complications were discussed with the                            patient. Return to normal activities tomorrow.                            Written discharge instructions were provided to the                            patient.                           - High fiber diet.                           - Continue present medications.                           - Await pathology results.                           - Repeat colonoscopy in 5 years for surveillance.                           - Return to GI office in 4 months. Procedure Code(s):        --- Professional ---                           [phone], Colonoscopy, flexible; with removal of                            tumor(s), polyp(s), or other lesion(s) by snare                            technique                           45380, 59, Colonoscopy, flexible; with biopsy,                            single or multiple                            45381, Colonoscopy, flexible; with directed                            submucosal injection(s), any substance Diagnosis Code(s):        --- Professional ---                           K64.8, Other hemorrhoids                           D12.4, Benign neoplasm of descending colon                           D12.5, Benign neoplasm of sigmoid colon  K62.89, Other specified diseases of anus and rectum                           R19.7, Diarrhea, unspecified                           K92.1, Melena (includes Hematochezia)                           K57.30, Diverticulosis of large intestine without                            perforation or abscess without bleeding                           Q43.8, Other specified congenital malformations of                            intestine CPT copyright 2018 [employer]. All rights reserved. The codes documented in this report are preliminary and upon coder review may  be revised to meet current compliance requirements. Barney Drain, MD Barney Drain MD, MD 06/07/2018 11:27:12 AM This report has been signed electronically. Number of Addenda: 0

## 2018-06-07 NOTE — H&P (Addendum)
Primary Care Physician:  Denyce Robert, FNP Primary Gastroenterologist:  Dr. Oneida Alar  Pre-Procedure History & Physical: HPI:  Carla Dunlap is a 68 y.o. female here for PERSONAL HISTORY OF colon cancer: 2012, RECTAL BLEEDING, AND ABDOMINAL PAIN. FEELS LIKE RECTUM DROPS DOWN.  Past Medical History:  Diagnosis Date  . Back pain of lumbar region with sciatica   . Chronic low back pain 03/18/2018  . Colon cancer ([place]) 2012  . Fibromyalgia   . Gait disturbance 03/18/2018  . Heart murmur   . History of kidney stones   . Neuropathy   . Peripheral neuropathy 03/18/2018  . 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  . Vision abnormalities     Past Surgical History:  Procedure Laterality Date  . ABDOMINAL HYSTERECTOMY  1999  . CESAREAN SECTION     x 2  . COLON SURGERY  2012  . COLONOSCOPY  2017   NO POLYPS  . EYE SURGERY     strabismys    Prior to Admission medications   Medication Sig Start Date End Date Taking? Authorizing Provider  acetaminophen (TYLENOL) 650 MG CR tablet Take 650 mg by mouth every 8 (eight) hours as needed for pain.   Yes [provider]  clonazePAM (KLONOPIN) 1 MG tablet Take 0.5 mg by mouth daily as needed for anxiety.    Yes [provider]  cyclobenzaprine (FLEXERIL) 5 MG tablet Take 2 tablets (10 mg total) by mouth 2 (two) times daily as needed for muscle spasms. 11/19/17  Yes Valarie Merino, MD  diclofenac sodium (VOLTAREN) 1 % GEL Apply 1 application topically every 6 (six) hours as needed (for pain).   Yes [provider]  diphenoxylate-atropine (LOMOTIL) 2.5-0.025 MG tablet 1 PO IN THE AM AND QHS Patient taking differently: Take 1 tablet by mouth 2 (two) times daily.  10/15/17  Yes Ariza Evans L, MD  FLUoxetine (PROZAC) 20 MG capsule Take 20 mg by mouth daily.   Yes [provider]  fluticasone (FLONASE) 50 MCG/ACT nasal spray Place 2 sprays into both nostrils daily.   Yes [provider]  gabapentin  (NEURONTIN) 400 MG capsule Take 400 mg by mouth 3 (three) times daily.   Yes [provider]  hydrocortisone (ANUSOL-HC) 25 MG suppository Place 25 mg rectally 2 (two) times daily as needed for hemorrhoids or anal itching.   Yes [provider]  hydroxyurea (HYDREA) 500 MG capsule Take 1,000 mg by mouth daily. May take with food to minimize GI side effects.   Yes [provider]  hydrOXYzine (ATARAX/VISTARIL) 25 MG tablet Take 25 mg by mouth See admin instructions. Take 25 mg by mouth 3 times daily. Take 25 mg by mouth 3 times daily as needed for anxiety.   Yes [provider]  levETIRAcetam (KEPPRA) 750 MG tablet Take 1 tablet (750 mg total) by mouth 2 (two) times daily. 03/18/18  Yes Kathrynn Ducking, MD  levothyroxine (SYNTHROID, LEVOTHROID) 25 MCG tablet Take 25 mcg by mouth daily before breakfast.   Yes [provider]  Melatonin 3 MG TABS Take 3 mg by mouth at bedtime.    Yes [provider]  Na Sulfate-K Sulfate-Mg Sulf (SUPREP BOWEL PREP KIT) 17.5-3.13-1.6 GM/177ML SOLN Take 1 kit by mouth as directed. 03/17/18  Yes Azim Gillingham L, MD  nystatin (MYCOSTATIN) 100000 UNIT/ML suspension Take 5 mLs by mouth 2 (two) times daily.   Yes [provider]  prochlorperazine (COMPAZINE) 10 MG tablet Take 10  mg by mouth 2 (two) times daily as needed for nausea.    Yes [provider]  rOPINIRole (REQUIP) 2 MG tablet Take 2 mg by mouth at bedtime.    Yes [provider]  traZODone (DESYREL) 50 MG tablet Take 50 mg by mouth at bedtime.   Yes [provider]  lidocaine (LIDODERM) 5 % Place 1 patch onto the skin daily. Remove & Discard patch within 12 hours or as directed by MD    [provider]  oxyCODONE-acetaminophen (PERCOCET) 10-325 MG tablet Take 1 tablet by mouth 2 (two) times daily as needed for pain.    [provider]  topiramate (TOPAMAX) 50 MG tablet Topamax 43m qam and 1056mqhs for 19 days  (already 2 days into taper) Topamax 5055mid for 21 days Topamax 56m32ms for 21 days Patient not taking: Reported on 05/19/2018 03/21/18   WillKathrynn Ducking    Allergies as of 03/17/2018 - Review Complete 01/27/2018  Allergen Reaction Noted  . Anti-inflammatory enzyme [nutritional supplements]  08/15/2017  . Aspirin  08/15/2017  . Penicillins  08/15/2017    Family History  Problem Relation Age of Onset  . Scleroderma Mother   . Stroke Father   . Brain cancer Sister   . Obesity Brother   . Heart disease Brother   . Bone cancer Sister   . Colon cancer Neg Hx   . Colon polyps Neg Hx     Social History   Socioeconomic History  . Marital status: Divorced    Spouse name: Not on file  . Number of children: Not on file  . Years of education: Not on file  . Highest education level: Not on file  Occupational History  . Not on file  Social Needs  . Financial resource strain: Not on file  . Food insecurity:    Worry: Not on file    Inability: Not on file  . Transportation needs:    Medical: Not on file    Non-medical: Not on file  Tobacco Use  . Smoking status: Former Smoker    Packs/day: 0.50    Years: 20.00    Pack years: 10.00    Last attempt to quit: 10/22/2017    Years since quitting: 0.6  . Smokeless tobacco: Never Used  Substance and Sexual Activity  . Alcohol use: Never    Frequency: Never  . Drug use: Never  . Sexual activity: Yes    Birth control/protection: Surgical  Lifestyle  . Physical activity:    Days per week: Not on file    Minutes per session: Not on file  . Stress: Not on file  Relationships  . Social connections:    Talks on phone: Not on file    Gets together: Not on file    Attends religious service: Not on file    Active member of club or organization: Not on file    Attends meetings of clubs or organizations: Not on file    Relationship status: Not on file  . Intimate partner violence:    Fear of current or ex partner: Not on file     Emotionally abused: Not on file    Physically abused: Not on file    Forced sexual activity: Not on file  Other Topics Concern  . Not on file  Social History Narrative  . Not on file    Review of Systems: See HPI, otherwise negative ROS   Physical Exam: BP (!) 158/83  Pulse 91   Temp 98.2 F (36.8 C) (Oral)   Resp 14   Ht '5\' 1"'  (1.549 m)   Wt 79.3 kg   BMI 33.03 kg/m  General:   Alert,  pleasant and cooperative in NAD Head:  Normocephalic and atraumatic. Neck:  Supple; Lungs:  Clear throughout to auscultation.    Heart:  Regular rate and rhythm. Abdomen:  Soft, nontender and nondistended. Normal bowel sounds, without guarding, and without rebound.   Neurologic:  Alert and  oriented x4;  grossly normal neurologically.  Impression/Plan:     PERSONAL HISTORY OF colon cancer.  PLAN: 1. TCS TODAY. DISCUSSED PROCEDURE, BENEFITS, & RISKS: < 1% chance of medication reaction, bleeding, perforation, or rupture of spleen/liver.  Marland Kitchen

## 2018-06-07 NOTE — Anesthesia Postprocedure Evaluation (Signed)
Anesthesia Post Note  Patient: Carla Dunlap  Procedure(s) Performed: COLONOSCOPY WITH PROPOFOL (N/A ) BIOPSY POLYPECTOMY  Patient location during evaluation: PACU Anesthesia Type: MAC Level of consciousness: awake and patient cooperative Pain management: pain level controlled Vital Signs Assessment: post-procedure vital signs reviewed and stable Respiratory status: spontaneous breathing, nonlabored ventilation, respiratory function stable and patient connected to nasal cannula oxygen Cardiovascular status: blood pressure returned to baseline Postop Assessment: no apparent nausea or vomiting Anesthetic complications: no     Last Vitals:  Vitals:   06/07/18 0947 06/07/18 1115  BP: (!) 158/83 (!) 143/81  Pulse: 91 81  Resp: 14 14  Temp: 36.8 C 36.8 C  SpO2:  97%    Last Pain:  Vitals:   06/07/18 1033  TempSrc:   PainSc: 8                  Loyal Holzheimer J

## 2018-06-13 ENCOUNTER — Other Ambulatory Visit: Payer: Self-pay | Admitting: Gastroenterology

## 2018-06-13 ENCOUNTER — Encounter (HOSPITAL_COMMUNITY): Payer: Self-pay | Admitting: Gastroenterology

## 2018-06-13 ENCOUNTER — Other Ambulatory Visit: Payer: Self-pay

## 2018-06-13 DIAGNOSIS — K623 Rectal prolapse: Secondary | ICD-10-CM

## 2018-06-13 NOTE — Progress Notes (Signed)
Scheduled and on recall °

## 2018-06-13 NOTE — Progress Notes (Signed)
I spoke to Everlean Patterson, the caregiver at facility and she is aware. She asked if they could get order for fiber supplement and do that instead of the high fiber diet. Also, needs order  And RX for the Prep H.  I can fax to her @ [phone].  Forwarding to [facility] to make referral.

## 2018-06-13 NOTE — Progress Notes (Signed)
Faxed to [facility] and she is aware I was faxing.

## 2018-06-15 ENCOUNTER — Other Ambulatory Visit: Payer: Self-pay

## 2018-06-15 ENCOUNTER — Encounter (HOSPITAL_COMMUNITY): Payer: Self-pay | Admitting: *Deleted

## 2018-06-15 ENCOUNTER — Ambulatory Visit (HOSPITAL_COMMUNITY): Payer: Medicare Other | Admitting: Anesthesiology

## 2018-06-15 ENCOUNTER — Ambulatory Visit (HOSPITAL_COMMUNITY)
Admission: RE | Admit: 2018-06-15 | Discharge: 2018-06-15 | Disposition: A | Discharge status: Skilled Nursing Facility | Payer: Medicare Other | Attending: Gastroenterology | Admitting: Gastroenterology

## 2018-06-15 ENCOUNTER — Encounter (HOSPITAL_COMMUNITY)
Admission: RE | Disposition: A | Discharge status: Skilled Nursing Facility | Payer: Self-pay | Source: Home / Self Care | Attending: Gastroenterology

## 2018-06-15 DIAGNOSIS — Z7982 Long term (current) use of aspirin: Secondary | ICD-10-CM | POA: Diagnosis not present

## 2018-06-15 DIAGNOSIS — K862 Cyst of pancreas: Secondary | ICD-10-CM | POA: Insufficient documentation

## 2018-06-15 DIAGNOSIS — Z880 Allergy status to penicillin: Secondary | ICD-10-CM | POA: Insufficient documentation

## 2018-06-15 DIAGNOSIS — Z886 Allergy status to analgesic agent status: Secondary | ICD-10-CM | POA: Diagnosis not present

## 2018-06-15 DIAGNOSIS — R569 Unspecified convulsions: Secondary | ICD-10-CM | POA: Insufficient documentation

## 2018-06-15 DIAGNOSIS — M797 Fibromyalgia: Secondary | ICD-10-CM | POA: Diagnosis not present

## 2018-06-15 DIAGNOSIS — K8020 Calculus of gallbladder without cholecystitis without obstruction: Secondary | ICD-10-CM | POA: Diagnosis not present

## 2018-06-15 DIAGNOSIS — Z79890 Hormone replacement therapy (postmenopausal): Secondary | ICD-10-CM | POA: Diagnosis not present

## 2018-06-15 DIAGNOSIS — Z79899 Other long term (current) drug therapy: Secondary | ICD-10-CM | POA: Diagnosis not present

## 2018-06-15 DIAGNOSIS — Z87891 Personal history of nicotine dependence: Secondary | ICD-10-CM | POA: Diagnosis not present

## 2018-06-15 HISTORY — PX: ESOPHAGOGASTRODUODENOSCOPY: SHX5428

## 2018-06-15 HISTORY — PX: EUS: SHX5427

## 2018-06-15 SURGERY — UPPER ENDOSCOPIC ULTRASOUND (EUS) LINEAR
Anesthesia: Monitor Anesthesia Care

## 2018-06-15 MED ORDER — PROPOFOL 500 MG/50ML IV EMUL
INTRAVENOUS | Status: DC | PRN
  Administered 2018-06-15: 125 ug/kg/min via INTRAVENOUS

## 2018-06-15 MED ORDER — SODIUM CHLORIDE 0.9 % IV SOLN: INTRAVENOUS | Status: DC

## 2018-06-15 MED ORDER — OXYCODONE-ACETAMINOPHEN 5-325 MG PO TABS
2.0000 | ORAL_TABLET | Freq: Once | ORAL | Status: AC
  Administered 2018-06-15: 2 via ORAL
  Filled 2018-06-15: qty 2

## 2018-06-15 MED ORDER — LACTATED RINGERS IV SOLN
INTRAVENOUS | Status: DC
  Administered 2018-06-15: 10:00:00 via INTRAVENOUS

## 2018-06-15 MED ORDER — CIPROFLOXACIN IN D5W 400 MG/200ML IV SOLN
INTRAVENOUS | Status: AC
  Filled 2018-06-15: qty 200

## 2018-06-15 MED ORDER — PROPOFOL 10 MG/ML IV BOLUS
INTRAVENOUS | Status: DC | PRN
  Administered 2018-06-15: 20 mg via INTRAVENOUS

## 2018-06-15 MED ORDER — PROPOFOL 10 MG/ML IV BOLUS
INTRAVENOUS | Status: AC
  Filled 2018-06-15: qty 60

## 2018-06-15 NOTE — Anesthesia Preprocedure Evaluation (Signed)
Anesthesia Evaluation  Patient identified by MRN, date of birth, ID band Patient awake    Reviewed: Allergy & Precautions, NPO status , Patient's Chart, lab work & pertinent test results  Airway Mallampati: II  TM Distance: >3 FB Neck ROM: Full    Dental no notable dental hx.    Pulmonary neg pulmonary ROS, former smoker,    Pulmonary exam normal breath sounds clear to auscultation       Cardiovascular negative cardio ROS Normal cardiovascular exam Rhythm:Regular Rate:Normal     Neuro/Psych Seizures -,   Neuromuscular disease negative psych ROS   GI/Hepatic negative GI ROS, Neg liver ROS,   Endo/Other  negative endocrine ROS  Renal/GU negative Renal ROS  negative genitourinary   Musculoskeletal negative musculoskeletal ROS (+)   Abdominal   Peds negative pediatric ROS (+)  Hematology negative hematology ROS (+)   Anesthesia Other Findings   Reproductive/Obstetrics negative OB ROS                             Anesthesia Physical Anesthesia Plan  ASA: II  Anesthesia Plan: MAC   Post-op Pain Management:    Induction: Intravenous  PONV Risk Score and Plan: 0  Airway Management Planned: Simple Face Mask  Additional Equipment:   Intra-op Plan:   Post-operative Plan:   Informed Consent: I have reviewed the patients History and Physical, chart, labs and discussed the procedure including the risks, benefits and alternatives for the proposed anesthesia with the patient or authorized representative who has indicated his/her understanding and acceptance.     Dental advisory given  Plan Discussed with: CRNA and Surgeon  Anesthesia Plan Comments:         Anesthesia Quick Evaluation

## 2018-06-15 NOTE — Discharge Instructions (Signed)

## 2018-06-15 NOTE — H&P (Signed)
Patient interval history reviewed.  Patient examined again.  There has been no change from documented H/P (scanned into chart from our office) except as documented above.  Assessment:  1.  Pancreatic cyst.  Plan:  1.  Endoscopic ultrasound with possible fine needle aspiration. 2.  Risks (bleeding, infection, bowel perforation that could require surgery, sedation-related changes in cardiopulmonary systems), benefits (identification and possible treatment of source of symptoms, exclusion of certain causes of symptoms), and alternatives (watchful waiting, radiographic imaging studies, empiric medical treatment) of upper endoscopy with ultrasound and possible cyst aspiration (EUS +/- FNA) were explained to patient/family in detail and patient wishes to proceed.

## 2018-06-15 NOTE — Op Note (Signed)
[facility] Patient Name: Carla Dunlap Procedure Date: 06/15/2018 MRN: [id number] Attending MD: Arta Silence , MD Date of Birth: [birth year] CSN: [id number] Age: 68 Admit Type: Outpatient Procedure:                Upper EUS Indications:              Pancreatic cyst on MRCP Providers:                Arta Silence, MD, Dorise Hiss, RN, Cleda Daub, RN, Elspeth Cho Tech., Technician, Lind Covert CRNA, CRNA Referring MD:             Dr. Barney Drain Medicines:                Monitored Anesthesia Care Complications:            No immediate complications. Estimated Blood Loss:     Estimated blood loss: none. Procedure:                Pre-Anesthesia Assessment:                           - Prior to the procedure, a History and Physical                            was performed, and patient medications and                            allergies were reviewed. The patient's tolerance of                            previous anesthesia was also reviewed. The risks                            and benefits of the procedure and the sedation                            options and risks were discussed with the patient.                            All questions were answered, and informed consent                            was obtained. Prior Anticoagulants: The patient has                            taken no previous anticoagulant or antiplatelet                            agents. ASA Grade Assessment: II - A patient with  mild systemic disease. After reviewing the risks                            and benefits, the patient was deemed in                            satisfactory condition to undergo the procedure.                           After obtaining informed consent, the endoscope was                            passed under direct vision. Throughout the                            procedure, the  patient's blood pressure, pulse, and                            oxygen saturations were monitored continuously. The                            [id number] ([id number]) Olympus Linear EUS was                            introduced through the mouth, and advanced to the                            second part of duodenum. The upper EUS was                            accomplished without difficulty. The patient                            tolerated the procedure well. Scope In: Scope Out: Findings:      ENDOSONOGRAPHIC FINDING: :      There was no sign of significant endosonographic abnormality in the       common bile duct.      Many stones were visualized endosonographically in the gallbladder. The       stones were triangular. They were characterized by shadowing.      No lymphadenopathy seen.      There was no sign of significant endosonographic abnormality in the left       lobe of the liver.      A hypoechoic and multicystic lesion suggestive of a cyst was identified       in the pancreatic body. It is not in obvious communication with the       pancreatic duct. The lesion measured 17 mm by 12 mm in maximal       cross-sectional diameter. Lesion was microcystic (with couple larger,       but still only few mm in diameter, larger cystic regions), honeycombed       in appearance, with many thin septations. The outer wall of the lesion       was not seen. There was no associated mass. There was no internal debris       within  the fluid-filled cavity. Impression:               - There was no sign of significant pathology in the                            common bile duct.                           - Many stones were visualized endosonographically                            in the gallbladder.                           - There was no evidence of significant pathology in                            the left lobe of the liver.                           - A cystic lesion was seen in the pancreatic  body.                            The endosonographic appearance is highly suspicious                            for a serous cystadenoma. Moderate Sedation:      None Recommendation:           - Discharge patient to home (via wheelchair).                           - Resume previous diet today.                           - Continue present medications.                           - Return to GI clinic (Dr. Oneida Alar) as previously                            scheduled.                           - Perform magnetic resonance imaging (MRI) with                            gadolinium in 1 year.                           - No further work-up for pancreatic cyst is needed                            at the present time. Procedure Code(s):        --- Professional ---  [phone], Esophagogastroduodenoscopy, flexible,                            transoral; with endoscopic ultrasound examination,                            including the esophagus, stomach, and either the                            duodenum or a surgically altered stomach where the                            jejunum is examined distal to the anastomosis Diagnosis Code(s):        --- Professional ---                           K80.20, Calculus of gallbladder without                            cholecystitis without obstruction                           K86.2, Cyst of pancreas CPT copyright 2018 American Medical Association. All rights reserved. The codes documented in this report are preliminary and upon coder review may  be revised to meet current compliance requirements. Arta Silence, MD 06/15/2018 10:48:05 AM This report has been signed electronically. Number of Addenda: 0

## 2018-06-15 NOTE — Transfer of Care (Signed)
Immediate Anesthesia Transfer of Care Note  Patient: Carla Dunlap  Procedure(s) Performed: UPPER ENDOSCOPIC ULTRASOUND (EUS) LINEAR Have Cipro and redpath available (N/A )  Patient Location: PACU  Anesthesia Type:MAC  Level of Consciousness: sedated  Airway & Oxygen Therapy: Patient Spontanous Breathing and Patient connected to nasal cannula oxygen  Post-op Assessment: Report given to RN and Post -op Vital signs reviewed and stable  Post vital signs: Reviewed and stable  Last Vitals:  Vitals Value Taken Time  BP    Temp    Pulse 80 06/15/2018 10:39 AM  Resp 11 06/15/2018 10:39 AM  SpO2 92 % 06/15/2018 10:39 AM  Vitals shown include unvalidated device data.  Last Pain:  Vitals:   06/15/18 1005  TempSrc: Oral  PainSc: 8          Complications: No apparent anesthesia complications

## 2018-06-15 NOTE — Anesthesia Postprocedure Evaluation (Signed)
Anesthesia Post Note  Patient: Carla Dunlap  Procedure(s) Performed: UPPER ENDOSCOPIC ULTRASOUND (EUS) LINEAR Have Cipro and redpath available (N/A )     Patient location during evaluation: PACU Anesthesia Type: MAC Level of consciousness: awake and alert Pain management: pain level controlled Vital Signs Assessment: post-procedure vital signs reviewed and stable Respiratory status: spontaneous breathing, nonlabored ventilation, respiratory function stable and patient connected to nasal cannula oxygen Cardiovascular status: stable and blood pressure returned to baseline Postop Assessment: no apparent nausea or vomiting Anesthetic complications: no    Last Vitals:  Vitals:   06/15/18 1005 06/15/18 1040  BP: (!) 165/80 (!) 141/68  Pulse: 82 77  Resp: 17 17  Temp: 36.5 C 36.6 C  SpO2: 95% 92%    Last Pain:  Vitals:   06/15/18 1040  TempSrc: Oral  PainSc: 10-Worst pain ever                 Alson Mcpheeters S

## 2018-06-16 ENCOUNTER — Encounter (HOSPITAL_COMMUNITY): Payer: Self-pay | Admitting: Gastroenterology

## 2018-07-06 ENCOUNTER — Encounter: Payer: Self-pay | Admitting: Gastroenterology

## 2018-07-12 ENCOUNTER — Telehealth: Payer: Self-pay | Admitting: Gastroenterology

## 2018-07-12 NOTE — Telephone Encounter (Signed)
I called and spoke to [facility] and she said they had been trying to get PCP to refill the lomotil and they have finally done so.  She said the compazine was written for as needed and pt has been taking morning and evening.  She thinks the pt is doing well and probably will not need to stay on it, but for now if it is [facility] they will just keep it as needed. They do not need any at this time.

## 2018-07-12 NOTE — Telephone Encounter (Signed)
Pt stays at [facility] and they have questions about her lomotil and compazine. Please call either Cinda Quest or Todd at [phone]

## 2018-07-12 NOTE — Telephone Encounter (Signed)
Sending FYI to Leslie  Lewis, PA.  

## 2018-07-13 NOTE — Telephone Encounter (Signed)
noted 
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
## 2018-07-19 ENCOUNTER — Encounter: Payer: Self-pay | Admitting: Nurse Practitioner

## 2018-07-19 ENCOUNTER — Ambulatory Visit (INDEPENDENT_AMBULATORY_CARE_PROVIDER_SITE_OTHER): Payer: Medicare Other | Admitting: Nurse Practitioner

## 2018-07-19 VITALS — BP 136/88 | HR 90 | Ht 61.0 in | Wt 193.0 lb

## 2018-07-19 DIAGNOSIS — G619 Inflammatory polyneuropathy, unspecified: Secondary | ICD-10-CM | POA: Diagnosis not present

## 2018-07-19 DIAGNOSIS — R569 Unspecified convulsions: Secondary | ICD-10-CM | POA: Diagnosis not present

## 2018-07-19 DIAGNOSIS — R269 Unspecified abnormalities of gait and mobility: Secondary | ICD-10-CM | POA: Diagnosis not present

## 2018-07-19 MED ORDER — LEVETIRACETAM 750 MG PO TABS: 750.0000 mg | ORAL_TABLET | Freq: Two times a day (BID) | ORAL | 3 refills | Status: DC

## 2018-07-19 NOTE — Progress Notes (Signed)
I have read the note, and I agree with the clinical assessment and plan.  Candelaria Pies K Dalisha Shively   

## 2018-07-19 NOTE — Patient Instructions (Signed)
Continue Keppra 750 twice daily we will refill Continue diclofenac topical lotion for her back pain Continue Neurontin for peripheral neuropathy Follow-up yearly and as needed

## 2018-09-14 ENCOUNTER — Ambulatory Visit (INDEPENDENT_AMBULATORY_CARE_PROVIDER_SITE_OTHER): Payer: Medicare Other | Admitting: Gastroenterology

## 2018-09-14 ENCOUNTER — Other Ambulatory Visit: Payer: Self-pay

## 2018-09-14 ENCOUNTER — Encounter: Payer: Self-pay | Admitting: Gastroenterology

## 2018-09-14 DIAGNOSIS — K869 Disease of pancreas, unspecified: Secondary | ICD-10-CM

## 2018-09-14 DIAGNOSIS — K219 Gastro-esophageal reflux disease without esophagitis: Secondary | ICD-10-CM | POA: Diagnosis not present

## 2018-09-14 DIAGNOSIS — R197 Diarrhea, unspecified: Secondary | ICD-10-CM

## 2018-09-14 NOTE — Assessment & Plan Note (Signed)
SYMPTOMS NOT IDEALLY CONTROLLED OFF PPI.  DRINK WATER TO KEEP YOUR URINE LIGHT YELLOW. TO CONTROL REFLUX AND REGURGITATION:   1.  AVOID REFLUX TRIGGERS. SEE INFO BELOW.   2. ADD OMEPRAZOLE 30 MINUTES PRIOR TO YOUR FIRST MEAL.    3. FOLLOW A LOW FAT DIET.  HANDOUT GIVEN. FOLLOW UP IN 6 MOS.

## 2018-09-14 NOTE — Assessment & Plan Note (Signed)
CLINICALLY IMPROVED. SYMPTOMS FAIRLY WELL CONTROLLED.  DRIN WATER TO KEEP YOUR URINE LIGHT YELLOW.  FOLLOW A LOW FAT/HIGH FIBER DIET. AVOID ITEMS THAT CAUSE BLOATING.   HANDOUT GIVEN. CONTINUE LOMOTIL. CALL WITH QUESTIONS OR CONCERNS. FOLLOW UP IN 6 MOS.

## 2018-09-14 NOTE — Progress Notes (Signed)
Subjective:    Patient ID: Carla Dunlap, female    DOB: 10/19/1950, 68 y.o.   MRN: [id number]   Primary Care Physician:  Denyce Robert, FNP  Primary GI:  Barney Drain, MD   Patient Location: home   Provider Location: [facility] office   Reason for Visit: RECTAL BLEEDING/DIARRHEA  Persons present on the virtual encounter, with roles: patient, myself (provider), MARTINA BOOTH CMA (update meds/allergies)   Total time (minutes) spent on medical discussion: 18 MINUTES   Due to COVID-19, visit was VIA TELEPHONE VISIT DUE TO COVID 19. VISIT IS CONDUCTED VIRTUALLY AND WAS REQUESTED BY PATIENT.   Virtual Visit via TELEPHONE   I connected with Carla Dunlap  and verified that I am speaking with the correct person using two identifiers.   I discussed the limitations, risks, security and privacy concerns of performing an evaluation and management service by telephone/video and the availability of in person appointments. I also discussed with the patient that there may be a patient responsible charge related to this service. The patient expressed understanding and agreed to proceed.   HPI GOING TO BATHROOM MUCH BETTER AND NOT MUCH BLEEDING AT ALL. THINKS COLONOSCOPY A MADE IT EASIER TO  GO TO BATHROOM. BMs: NL. DIARRHEA: 3-4X/MO OR ONCE A WEEK. TRIGGERS: REALLY STRESSED ABOUT BO BEING OUT OF CONTROL. MILD CP/SOB WHEN BP GOES UP. ABDOMINAL PAIN WHEN CHANGE IN BOWEL HABITS CHANGE(RLQ, SHARP, RADIATION, BETTER AFTER BMs MOST OF THE TIME). CAN GET REFLUX IN HER THROAT AND SLOWS DOWN HER EATING: <2-3X/WEEK.  PT DENIES FEVER, CHILLS, HEMATOCHEZIA, HEMATEMESIS, nausea, vomiting, melena, CHEST PAIN, SHORTNESS OF BREATH,  CHANGE IN BOWEL IN HABITS,  problems swallowing, OR problems with sedation.  Past Medical History:  Diagnosis Date  . Back pain of lumbar region with sciatica   . Chronic low back pain 03/18/2018  . Colon cancer ([place]) 2012  . Fibromyalgia   . Gait disturbance  03/18/2018  . Heart murmur   . History of kidney stones   . Neuropathy   . Peripheral neuropathy 03/18/2018  . 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  . Vision abnormalities    Past Surgical History:  Procedure Laterality Date  . ABDOMINAL HYSTERECTOMY  1999  . BIOPSY  06/07/2018   Procedure: BIOPSY;  Surgeon: Danie Binder, MD;  [facility];  Service: Endoscopy;;  colon  . CESAREAN SECTION     x 2  . COLON SURGERY  2012  . COLONOSCOPY  2017   NO POLYPS  . COLONOSCOPY WITH PROPOFOL N/A 06/07/2018   Procedure: COLONOSCOPY WITH PROPOFOL;  Surgeon: Danie Binder, MD;  [facility];  Service: Endoscopy;  Laterality: N/A;  10:30am  . ESOPHAGOGASTRODUODENOSCOPY N/A 06/15/2018   Procedure: ESOPHAGOGASTRODUODENOSCOPY (EGD);  Surgeon: Arta Silence, MD;  Location: [facility];  Service: Endoscopy;  Laterality: N/A;  . EUS N/A 06/15/2018   Procedure: UPPER ENDOSCOPIC ULTRASOUND (EUS) LINEAR Have Cipro and redpath available;  Surgeon: Arta Silence, MD;  Location: [facility];  Service: Endoscopy;  Laterality: N/A;  . EYE SURGERY     strabismys  . POLYPECTOMY  06/07/2018   Procedure: POLYPECTOMY;  Surgeon: Danie Binder, MD;  [facility];  Service: Endoscopy;;  colon   Allergies  Allergen Reactions  . Anti-Inflammatory Enzyme [Nutritional Supplements] Other (See Comments)    Unknown  . Aspirin Other (See Comments)    Unknown  . Nsaids   . Penicillins Other (See Comments)  Current Outpatient Medications  Medication Sig    . acetaminophen (TYLENOL) 650 MG CR tablet Take 650 mg PO TID     . cyclobenzaprine (FLEXERIL) 5 MG tablet 1 PO BID    . diclofenac sodium (VOLTAREN) 1 % GEL Apply 1 application TOP G8U PRN.    . diphenoxylate-atropine (LOMOTIL) 2.5-0.025 MG tablet 1 PO IN THE AM AND QHS    . FLUoxetine (PROZAC) 20 MG capsule Take 20 mg by mouth daily.    . fluticasone 50 MCG/ACT nasal spray Place 2 sprays into both nostrils  daily.    Marland Kitchen gabapentin (NEURONTIN) 400 MG capsule Take 400 mg PO TID daily.    . hydrochlorothiazide 25 MG tablet Take 25 mg by mouth daily.    .      . hydroxyurea (HYDREA) 500 MG capsule Take 1,000 PO QD.     . hydrOXYzine 25 MG tablet Take 25 mg TID PRN anxiety.    . levETIRAcetam (KEPPRA) 750 MG tablet Take 1 tablet PO BID    . levothyroxine 25 MCG tablet Take 25 mcg PO QD QAC breakfast.    . Melatonin 3 MG TABS Take 3 mg by mouth at bedtime.     . prochlorperazine (COMPAZINE) 10 MG tablet Take 10 mg PO BID PRN for nausea.     Marland Kitchen rOPINIRole (REQUIP) 2 MG tablet Take 2 mg by mouth at bedtime.     . traZODone (DESYREL) 50 MG tablet Take 50 mg by mouth at bedtime.    .       Review of Systems PER HPI OTHERWISE ALL SYSTEMS ARE NEGATIVE.    Objective:   Physical Exam  TELEPHONE VISIT DUE TO COVID 19, VISIT IS CONDUCTED VIRTUALLY AND WAS REQUESTED BY PATIENT.    Assessment & Plan:

## 2018-09-14 NOTE — Patient Instructions (Addendum)
DRINK WATER TO KEEP YOUR URINE LIGHT YELLOW.  FOLLOW A LOW FAT/HIGH FIBER DIET. AVOID ITEMS THAT CAUSE BLOATING. SEE INFO BELOW ON A LOW FAT/HIGH FIBER DIET.    TO CONTROL REFLUX AND REGURGITATION:   1.  AVOID REFLUX TRIGGERS. SEE INFO BELOW.   2. ADD OMEPRAZOLE 30 MINUTES PRIOR TO YOUR FIRST MEAL.  CONTINUE LOMOTIL.   PLEASE CALL WITH QUESTIONS OR CONCERNS.  FOLLOW UP IN 6 MOS.   Lifestyle and home remedies TO CONTROL REFLUX/REGURGITATION You may eliminate or reduce the frequency of heartburn by making the following lifestyle changes:  . Control your weight. Being overweight is a major risk factor for heartburn and GERD. Excess pounds put pressure on your abdomen, pushing up your stomach and causing acid to back up into your esophagus.   . Eat smaller meals. 4 TO 6 MEALS A DAY. This reduces pressure on the lower esophageal sphincter, helping to prevent the valve from opening and acid from washing back into your esophagus.   Dolphus Jenny your belt. Clothes that fit tightly around your waist put pressure on your abdomen and the lower esophageal sphincter.   . Eliminate heartburn triggers. Everyone has specific triggers. Common triggers such as fatty or fried foods, spicy food, tomato sauce, carbonated beverages, alcohol, chocolate, mint, garlic, onion, caffeine and nicotine may make heartburn worse.   Marland Kitchen Avoid stooping or bending. Tying your shoes is OK. Bending over for longer periods to weed your garden isn't, especially soon after eating.   . Don't lie down after a meal. Wait at least three to four hours after eating before going to bed, and don't lie down right after eating.   Alternative medicine . Several home remedies exist for treating GERD, but they provide only temporary relief. They include drinking baking soda (sodium bicarbonate) added to water or drinking other fluids such as baking soda mixed with cream of tartar and water. Although these liquids create temporary relief by  neutralizing, washing away or buffering acids, eventually they aggravate the situation by adding gas and fluid to your stomach, increasing pressure and causing more acid reflux. Further, adding more sodium to your diet may increase your blood pressure and add stress to your heart, and excessive bicarbonate ingestion can alter the acid-base balance in your body.  Low-Fat Diet BREADS, CEREALS, PASTA, RICE, DRIED PEAS, AND BEANS These products are high in carbohydrates and most are low in fat. Therefore, they can be increased in the diet as substitutes for fatty foods. They too, however, contain calories and should not be eaten in excess. Cereals can be eaten for snacks as well as for breakfast.   FRUITS AND VEGETABLES It is good to eat fruits and vegetables. Besides being sources of fiber, both are rich in vitamins and some minerals. They help you get the daily allowances of these nutrients. Fruits and vegetables can be used for snacks and desserts.  MEATS Limit lean meat, chicken, [place], and fish to no more than 6 ounces per day. Beef, Pork, and Lamb Use lean cuts of beef, pork, and lamb. Lean cuts include:  Extra-lean ground beef.  Arm roast.  Sirloin tip.  Center-cut ham.  Round steak.  Loin chops.  Rump roast.  Tenderloin.  Trim all fat off the outside of meats before cooking. It is not necessary to severely decrease the intake of red meat, but lean choices should be made. Lean meat is rich in protein and contains a highly absorbable form of iron. Premenopausal women, in particular,  should avoid reducing lean red meat because this could increase the risk for low red blood cells (iron-deficiency anemia).  Chicken and [place] These are good sources of protein. The fat of poultry can be reduced by removing the skin and underlying fat layers before cooking. Chicken and [place] can be substituted for lean red meat in the diet. Poultry should not be fried or covered with high-fat sauces. Fish and  Shellfish Fish is a good source of protein. Shellfish contain cholesterol, but they usually are low in saturated fatty acids. The preparation of fish is important. Like chicken and [place], they should not be fried or covered with high-fat sauces. EGGS Egg whites contain no fat or cholesterol. They can be eaten often. Try 1 to 2 egg whites instead of whole eggs in recipes or use egg substitutes that do not contain yolk. MILK AND DAIRY PRODUCTS Use skim or 1% milk instead of 2% or whole milk. Decrease whole milk, natural, and processed cheeses. Use nonfat or low-fat (2%) cottage cheese or low-fat cheeses made from vegetable oils. Choose nonfat or low-fat (1 to 2%) yogurt. Experiment with evaporated skim milk in recipes that call for heavy cream. Substitute low-fat yogurt or low-fat cottage cheese for sour cream in dips and salad dressings. Have at least 2 servings of low-fat dairy products, such as 2 glasses of skim (or 1%) milk each day to help get your daily calcium intake. FATS AND OILS Reduce the total intake of fats, especially saturated fat. Butterfat, lard, and beef fats are high in saturated fat and cholesterol. These should be avoided as much as possible. Vegetable fats do not contain cholesterol, but certain vegetable fats, such as coconut oil, palm oil, and palm kernel oil are very high in saturated fats. These should be limited. These fats are often used in bakery goods, processed foods, popcorn, oils, and nondairy creamers. Vegetable shortenings and some peanut butters contain hydrogenated oils, which are also saturated fats. Read the labels on these foods and check for saturated vegetable oils. Unsaturated vegetable oils and fats do not raise blood cholesterol. However, they should be limited because they are fats and are high in calories. Total fat should still be limited to 30% of your daily caloric intake. Desirable liquid vegetable oils are corn oil, cottonseed oil, olive oil, canola oil,  safflower oil, soybean oil, and sunflower oil. Peanut oil is not as good, but small amounts are acceptable. Buy a heart-healthy tub margarine that has no partially hydrogenated oils in the ingredients. Mayonnaise and salad dressings often are made from unsaturated fats, but they should also be limited because of their high calorie and fat content. Seeds, nuts, peanut butter, olives, and avocados are high in fat, but the fat is mainly the unsaturated type. These foods should be limited mainly to avoid excess calories and fat. OTHER EATING TIPS Snacks  Most sweets should be limited as snacks. They tend to be rich in calories and fats, and their caloric content outweighs their nutritional value. Some good choices in snacks are graham crackers, melba toast, soda crackers, bagels (no egg), English muffins, fruits, and vegetables. These snacks are preferable to snack crackers, [place] fries, TORTILLA CHIPS, and POTATO chips. Popcorn should be air-popped or cooked in small amounts of liquid vegetable oil. Desserts Eat fruit, low-fat yogurt, and fruit ices instead of pastries, cake, and cookies. Sherbet, angel food cake, gelatin dessert, frozen low-fat yogurt, or other frozen products that do not contain saturated fat (pure fruit juice bars, frozen ice  pops) are also acceptable.  COOKING METHODS Choose those methods that use little or no fat. They include: Poaching.  Braising.  Steaming.  Grilling.  Baking.  Stir-frying.  Broiling.  Microwaving.  Foods can be cooked in a nonstick pan without added fat, or use a nonfat cooking spray in regular cookware. Limit fried foods and avoid frying in saturated fat. Add moisture to lean meats by using water, broth, cooking wines, and other nonfat or low-fat sauces along with the cooking methods mentioned above. Soups and stews should be chilled after cooking. The fat that forms on top after a few hours in the refrigerator should be skimmed off. When preparing meals,  avoid using excess salt. Salt can contribute to raising blood pressure in some people.  EATING AWAY FROM HOME Order entres, potatoes, and vegetables without sauces or butter. When meat exceeds the size of a deck of cards (3 to 4 ounces), the rest can be taken home for another meal. Choose vegetable or fruit salads and ask for low-calorie salad dressings to be served on the side. Use dressings sparingly. Limit high-fat toppings, such as bacon, crumbled eggs, cheese, sunflower seeds, and olives. Ask for heart-healthy tub margarine instead of butter.   High-Fiber Diet A high-fiber diet changes your normal diet to include more whole grains, legumes, fruits, and vegetables. Changes in the diet involve replacing refined carbohydrates with unrefined foods. The calorie level of the diet is essentially unchanged. The Dietary Reference Intake (recommended amount) for adult males is 38 grams per day. For adult females, it is 25 grams per day. Pregnant and lactating women should consume 28 grams of fiber per day. Fiber is the intact part of a plant that is not broken down during digestion. Functional fiber is fiber that has been isolated from the plant to provide a beneficial effect in the body.  PURPOSE  Increase stool bulk.   Ease and regulate bowel movements.   Lower cholesterol.   REDUCE RISK OF COLON CANCER  INDICATIONS THAT YOU NEED MORE FIBER  Constipation and hemorrhoids.   Uncomplicated diverticulosis (intestine condition) and irritable bowel syndrome.   Weight management.   As a protective measure against hardening of the arteries (atherosclerosis), diabetes, and cancer.   GUIDELINES FOR INCREASING FIBER IN THE DIET  Start adding fiber to the diet slowly. A gradual increase of about 5 more grams (2 slices of whole-wheat bread, 2 servings of most fruits or vegetables, or 1 bowl of high-fiber cereal) per day is best. Too rapid an increase in fiber may result in constipation, flatulence,  and bloating.   Drink enough water and fluids to keep your urine clear or pale yellow. Water, juice, or caffeine-free drinks are recommended. Not drinking enough fluid may cause constipation.   Eat a variety of high-fiber foods rather than one type of fiber.   Try to increase your intake of fiber through using high-fiber foods rather than fiber pills or supplements that contain small amounts of fiber.   The goal is to change the types of food eaten. Do not supplement your present diet with high-fiber foods, but replace foods in your present diet.    INCLUDE A VARIETY OF FIBER SOURCES  Replace refined and processed grains with whole grains, canned fruits with fresh fruits, and incorporate other fiber sources. White rice, white breads, and most bakery goods contain little or no fiber.   Brown whole-grain rice, buckwheat oats, and many fruits and vegetables are all good sources of fiber. These include: broccoli,  Brussels sprouts, cabbage, cauliflower, beets, sweet potatoes, white potatoes (skin on), carrots, tomatoes, eggplant, squash, berries, fresh fruits, and dried fruits.   Cereals appear to be the richest source of fiber. Cereal fiber is found in whole grains and bran. Bran is the fiber-rich outer coat of cereal grain, which is largely removed in refining. In whole-grain cereals, the bran remains. In breakfast cereals, the largest amount of fiber is found in those with "bran" in their names. The fiber content is sometimes indicated on the label.   You may need to include additional fruits and vegetables each day.   In baking, for 1 cup white flour, you may use the following substitutions:   1 cup whole-wheat flour minus 2 tablespoons.   1/2 cup white flour plus 1/2 cup whole-wheat flour.

## 2018-09-14 NOTE — Assessment & Plan Note (Signed)
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  REPEAT MRI IN FEB 2021.

## 2018-09-15 NOTE — Progress Notes (Signed)
ON RECALL  °

## 2018-09-19 ENCOUNTER — Telehealth: Payer: Self-pay | Admitting: Gastroenterology

## 2018-09-19 DIAGNOSIS — K219 Gastro-esophageal reflux disease without esophagitis: Secondary | ICD-10-CM

## 2018-09-19 NOTE — Telephone Encounter (Signed)
PATIENT WAS SUPPOSED TO HAVE OMEPRAZOLE PRESCRIPTION SENT TO [facility], THEY HAVE NOT RECEIVED ONE YET

## 2018-09-20 ENCOUNTER — Telehealth: Payer: Self-pay | Admitting: *Deleted

## 2018-09-20 NOTE — Telephone Encounter (Signed)
Previous message was sent to [facility] refill Box. Rx wasn't called in by [facility].

## 2018-09-20 NOTE — Telephone Encounter (Signed)
Todd from [facility] called following up on Rx for patients omeprazole. Reports they have to still yet to receive Rx. He can be reached at [phone]

## 2018-09-20 NOTE — Telephone Encounter (Signed)
Pt had an appointment with Dr. Oneida Alar on 09/14/18. Pt was asked to add Omeprazole. RX wasn't sent in.

## 2018-09-22 ENCOUNTER — Ambulatory Visit: Payer: PRIVATE HEALTH INSURANCE | Admitting: Gastroenterology

## 2018-09-22 MED ORDER — OMEPRAZOLE 40 MG PO CPDR: 40.0000 mg | DELAYED_RELEASE_CAPSULE | Freq: Every day | ORAL | 3 refills | Status: DC

## 2018-09-22 NOTE — Telephone Encounter (Signed)
Noted. Carla Dunlap was notified.

## 2018-09-22 NOTE — Telephone Encounter (Signed)
Rx sent to pharmacy   

## 2018-09-22 NOTE — Addendum Note (Signed)
Addended by: Gordy Levan, Liseth Wann A on: 09/22/2018 02:13 PM   Modules accepted: Orders

## 2018-09-22 NOTE — Telephone Encounter (Signed)
Received another call from [facility] [phone] asking for pts Omeprazole RX that was suppose to be prescribed at her apt. Rip Harbour wants a call back when RX has been sent to pts pharmacy.

## 2018-10-06 ENCOUNTER — Ambulatory Visit: Payer: Self-pay | Admitting: Gastroenterology

## 2018-11-13 IMAGING — DX DG CHEST 2V
2 series · 2 of 2 positions shown · non-contrast
Comparison: None.

CLINICAL DATA: Chest pain day

EXAM:
CHEST - 2 VIEW

[x chest ap]
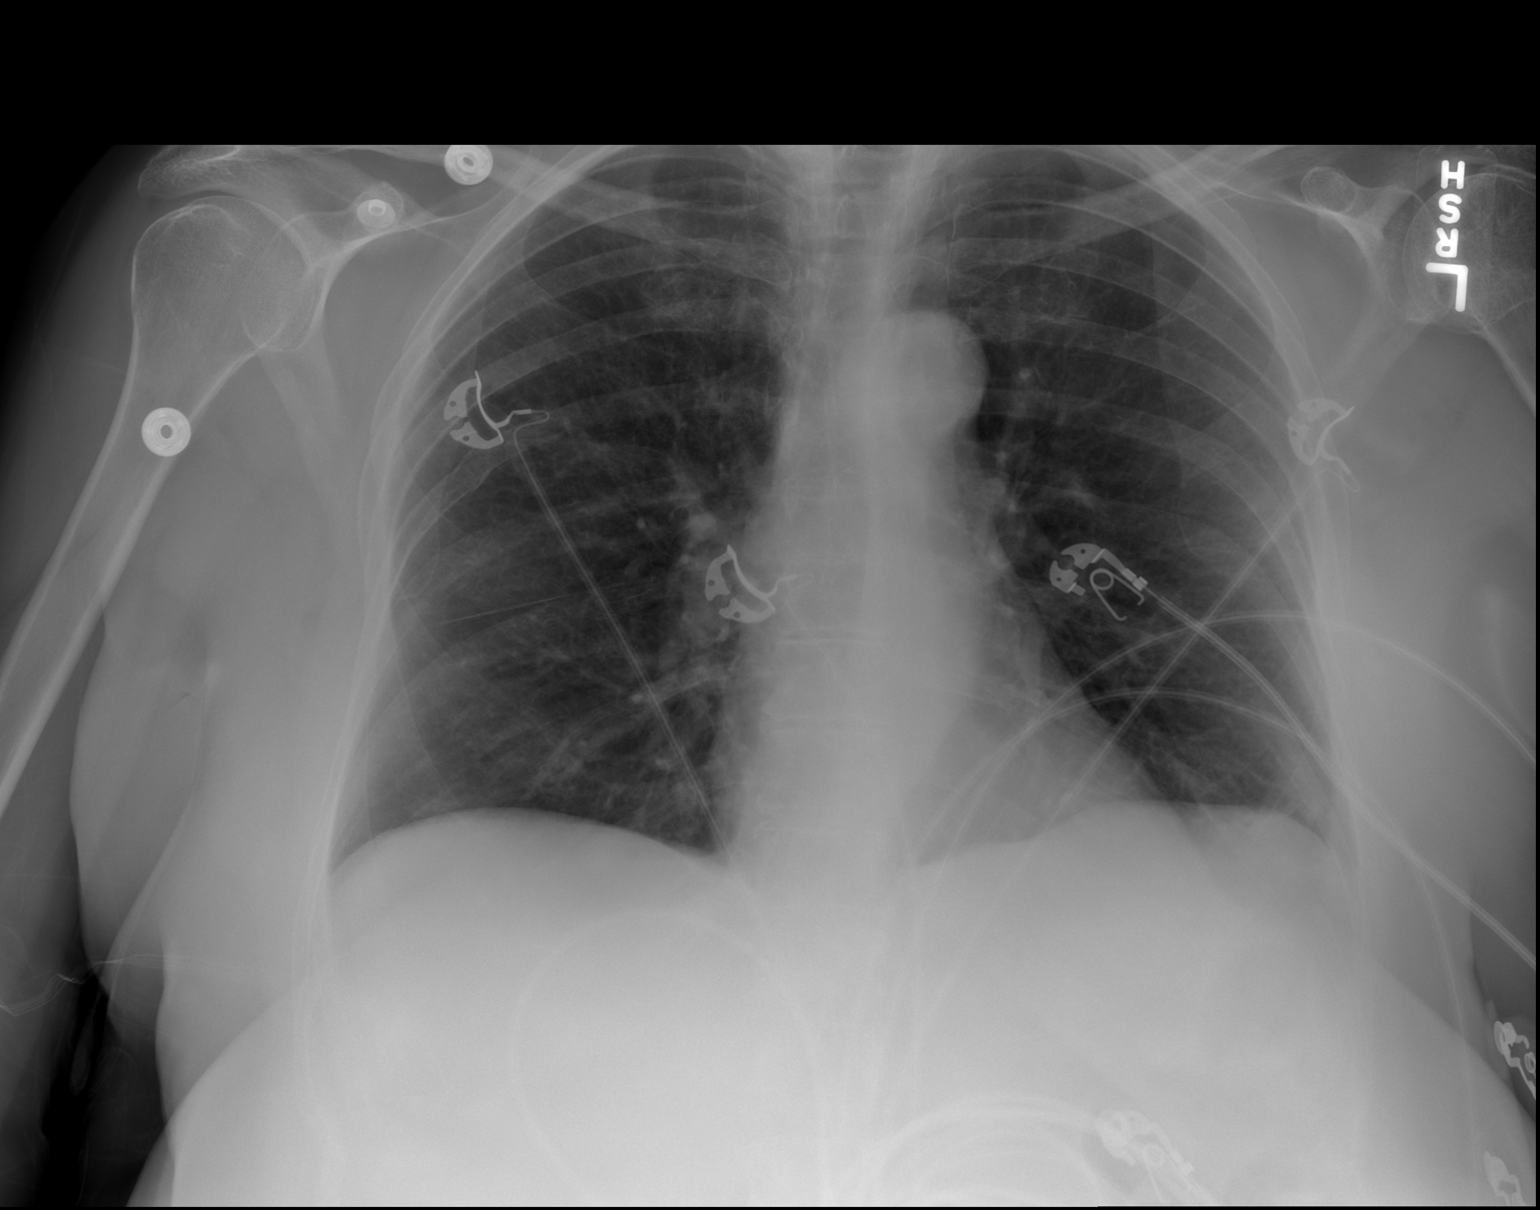

[w chest lat]
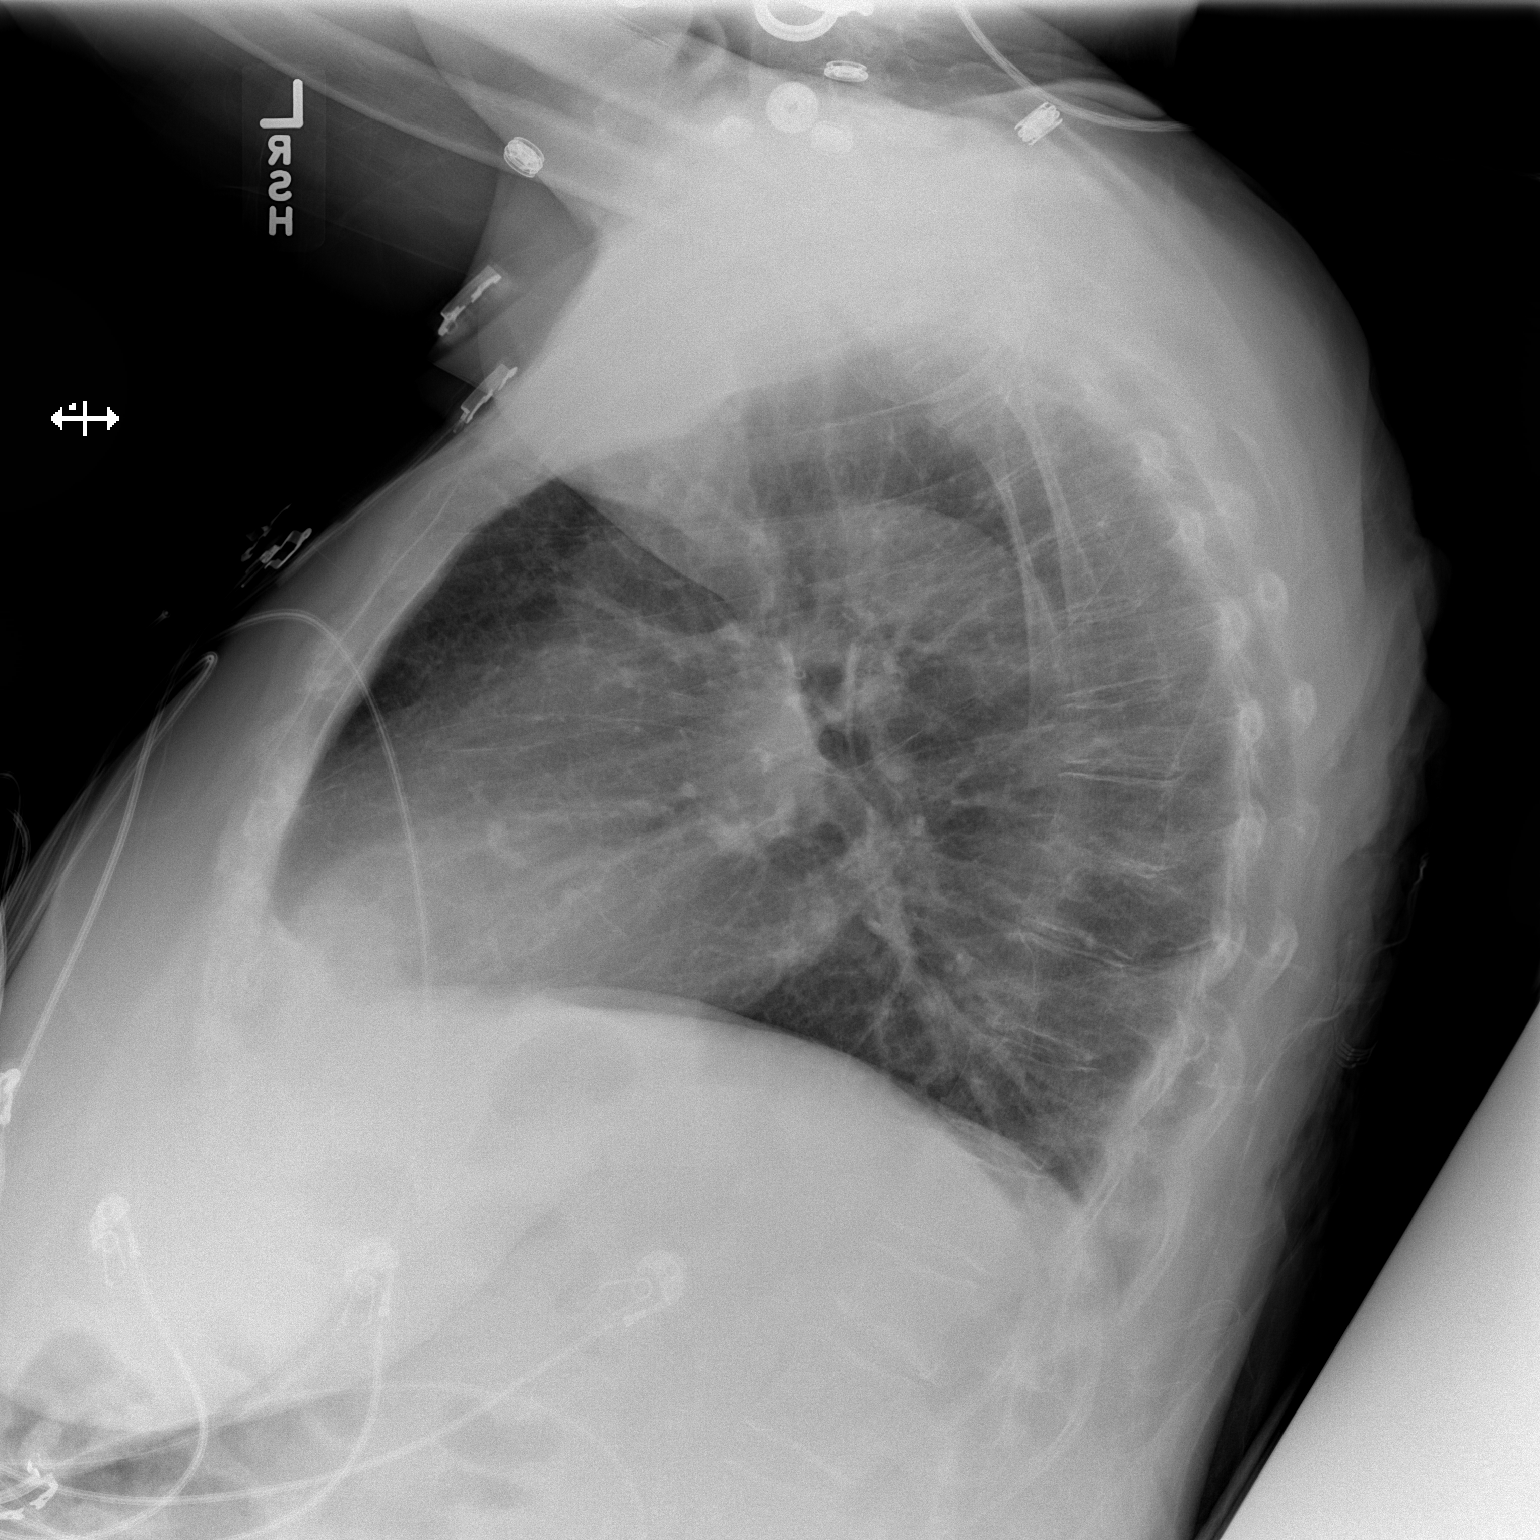

[2 of 2 positions shown; findings below may reference images not displayed]

FINDINGS: Heart size and mediastinal contours are within normal limits. Lungs
are clear. No pleural effusion or pneumothorax seen. No acute or
suspicious osseous finding. Mild kyphosis of the thoracic spine with
associated vertebral body wedging.
IMPRESSION: No active cardiopulmonary disease.

## 2018-11-29 ENCOUNTER — Other Ambulatory Visit: Payer: Self-pay | Admitting: Nurse Practitioner

## 2018-11-29 DIAGNOSIS — K219 Gastro-esophageal reflux disease without esophagitis: Secondary | ICD-10-CM

## 2019-02-28 ENCOUNTER — Encounter: Payer: Self-pay | Admitting: Gastroenterology

## 2019-03-27 ENCOUNTER — Encounter: Payer: Self-pay | Admitting: Gastroenterology

## 2019-03-27 ENCOUNTER — Ambulatory Visit (INDEPENDENT_AMBULATORY_CARE_PROVIDER_SITE_OTHER): Payer: Medicare Other | Admitting: Gastroenterology

## 2019-03-27 ENCOUNTER — Other Ambulatory Visit: Payer: Self-pay

## 2019-03-27 VITALS — BP 147/77 | HR 78 | Temp 96.6°F | Ht 61.0 in | Wt 207.4 lb

## 2019-03-27 DIAGNOSIS — K219 Gastro-esophageal reflux disease without esophagitis: Secondary | ICD-10-CM

## 2019-03-27 DIAGNOSIS — R1084 Generalized abdominal pain: Secondary | ICD-10-CM

## 2019-03-27 DIAGNOSIS — R197 Diarrhea, unspecified: Secondary | ICD-10-CM

## 2019-03-27 DIAGNOSIS — Z85038 Personal history of other malignant neoplasm of large intestine: Secondary | ICD-10-CM

## 2019-03-27 DIAGNOSIS — R109 Unspecified abdominal pain: Secondary | ICD-10-CM | POA: Insufficient documentation

## 2019-03-27 DIAGNOSIS — R635 Abnormal weight gain: Secondary | ICD-10-CM

## 2019-03-27 MED ORDER — DIPHENOXYLATE-ATROPINE 2.5-0.025 MG PO TABS: 1.0000 | ORAL_TABLET | Freq: Two times a day (BID) | ORAL | 5 refills | Status: DC

## 2019-03-27 NOTE — Progress Notes (Signed)
Primary Care Physician: Denyce Robert, FNP  Primary Gastroenterologist:  Barney Drain, MD   Chief Complaint  Patient presents with  . Constipation    with bleeding  . Abdominal Pain    lower abd    HPI: Carla Dunlap is a 68 y.o. female here for follow-up.  Last seen in May 2020.  Omeprazole added for acid reflux.  She also had an EUS in February 2020 showing probable serous cystadenoma. No specimen taken.  Plans for repeat MRI WITH GADOLINIUM in February 2021.  History of colon cancer status post resection in 2012, received chemo and radiation.  Weight is up 35 pounds since January 2020.  Since August 2019 she has gained 60 pounds.   Colonoscopy earlier this year.  She had a simple adenoma and 1 hyperplastic polyp removed from her colon.  Rectal biopsies consistent with rectal prolapse.  Colon biopsies normal.  Referral to general surgery at [facility] to address rectal prolapse.  Next colonoscopy in 5 years. Patient saw Dr. Morton Stall. unable to produce rectal prolapse while in his clinic therefore no surgical intervention recommended.   Patient states she has had diarrhea since her surgery and chemo for colon cancer. Had been on Lomotil since then, 2012. Recently PCP switched her Lomotil to Imodium and she does not feel like it does as well. Imodium has to take two, and then gets bound up. Lomotil helps diarrhea without producing constipation. Lower abdominal pain better on Lomotil. C/o generalized abdominal pain present most of the time. Not necessarily worse with meals or bowel function.  Reflux better. No n/v. No dysphagia. No melena. She has had some brbpr with straining. Patient states she was in hospital recently at [facility]. Will obtain copy of labs, DC summary.   Current Outpatient Medications  Medication Sig Dispense Refill  . acetaminophen (TYLENOL) 650 MG CR tablet Take 650 mg by mouth 3 (three) times daily.     . diclofenac sodium (VOLTAREN) 1 % GEL Apply 2 g  topically every 12 (twelve) hours as needed (for pain).     Marland Kitchen diclofenac Sodium (VOLTAREN) 1 % GEL Apply 4 g topically 4 (four) times daily.    Marland Kitchen FLUoxetine (PROZAC) 20 MG capsule Take 20 mg by mouth daily.    . fluticasone (FLONASE) 50 MCG/ACT nasal spray Place 2 sprays into both nostrils daily.    Marland Kitchen gabapentin (NEURONTIN) 400 MG capsule Take 400 mg by mouth 3 (three) times daily.    . hydrochlorothiazide (HYDRODIURIL) 50 MG tablet Take 50 mg by mouth daily.    . hydroxychloroquine (PLAQUENIL) 200 MG tablet Take 200 mg by mouth as needed. Take 1 tablet twice a week as needed for leg cramps    . hydroxyurea (HYDREA) 500 MG capsule Take 1,000 mg by mouth daily. May take with food to minimize GI side effects.    . hydrOXYzine (ATARAX/VISTARIL) 25 MG tablet Take 25 mg by mouth 2 (two) times daily.     . hydrOXYzine (ATARAX/VISTARIL) 25 MG tablet Take 25 mg by mouth 3 (three) times daily as needed.    . levETIRAcetam (KEPPRA) 750 MG tablet Take 1 tablet (750 mg total) by mouth 2 (two) times daily. 180 tablet 3  . levothyroxine (SYNTHROID, LEVOTHROID) 25 MCG tablet Take 25 mcg by mouth daily before breakfast.    . loperamide (ANTI-DIARRHEAL) 2 MG capsule Take 2-4 mg by mouth as needed for diarrhea or loose stools. Take 2 caps by mouth after first loose stool  and take 1 cap after each loose bowel movement, do not exceed 16mg  in 24 hours    . Melatonin 3 MG TABS Take 3 mg by mouth at bedtime.     . Omega-3 Fatty Acids (FISH OIL) 1000 MG CAPS Take 1 capsule by mouth 3 (three) times daily.    Marland Kitchen omeprazole (PRILOSEC) 40 MG capsule TAKE 1 CAPSULE BY MOUTH DAILY. 30 capsule 5  . prochlorperazine (COMPAZINE) 10 MG tablet Take 10 mg by mouth 2 (two) times daily as needed for nausea.     Marland Kitchen rOPINIRole (REQUIP) 2 MG tablet Take 2 mg by mouth at bedtime.     . traZODone (DESYREL) 50 MG tablet Take 50 mg by mouth at bedtime.     No current facility-administered medications for this visit.     Allergies as of  03/27/2019 - Review Complete 03/27/2019  Allergen Reaction Noted  . Anti-inflammatory enzyme [nutritional supplements] Other (See Comments) 08/15/2017  . Aspirin Other (See Comments) 08/15/2017  . Nsaids  05/28/2018  . Penicillins Other (See Comments) 08/15/2017    ROS:  General: Negative for anorexia, weight loss, fever, chills, fatigue, weakness. ENT: Negative for hoarseness, difficulty swallowing , nasal congestion. CV: Negative for chest pain, angina, palpitations, dyspnea on exertion, peripheral edema.  Respiratory: Negative for dyspnea at rest, dyspnea on exertion, cough, sputum, wheezing.  GI: See history of present illness. GU:  Negative for dysuria, hematuria, urinary incontinence, urinary frequency, nocturnal urination.  Endo: Negative for unusual weight change.    Physical Examination:   BP (!) 147/77   Pulse 78   Temp (!) 96.6 F (35.9 C) (Temporal)   Ht 5\' 1"  (1.549 m)   Wt 207 lb 6.4 oz (94.1 kg)   BMI 39.19 kg/m   General: Well-nourished, well-developed in no acute distress.  Eyes: No icterus. Mouth: Oropharyngeal mucosa moist and pink , no lesions erythema or exudate. Lungs: Clear to auscultation bilaterally.  Heart: Regular rate and rhythm, no murmurs rubs or gallops.  Abdomen: Bowel sounds are normal, generalized abd tenderness, nondistended, no hepatosplenomegaly or masses, no abdominal bruits or hernia , no rebound or guarding.   Extremities: 1+ lower extremity edema to knees bilaterally. No clubbing or deformities. Neuro: Alert and oriented x 4   Skin: Warm and dry, no jaundice.   Psych: Alert and cooperative, normal mood and affect.  Labs:  Lab Results  Component Value Date   CREATININE 0.67 05/28/2018   BUN 12 05/28/2018   NA 140 05/28/2018   K 3.3 (L) 05/28/2018   CL 102 05/28/2018   CO2 30 05/28/2018   Lab Results  Component Value Date   ALT 37 05/28/2018   AST 36 05/28/2018   ALKPHOS 92 05/28/2018   BILITOT 0.5 05/28/2018   Lab  Results  Component Value Date   WBC 4.8 05/28/2018   HGB 10.9 (L) 05/28/2018   HCT 32.3 (L) 05/28/2018   MCV 117.5 (H) 05/28/2018   PLT 189 05/28/2018    Imaging Studies: No results found.

## 2019-03-27 NOTE — Patient Instructions (Addendum)
1. Stop Imodium. 2. Start Lomotil 1 tablet at 8am and 1 tablet at 8pm each day. New RX sent to pharmacy. 3. I will review your recent labs from [facility]. Once reviewed, we will be in touch to order any additional labs that may be needed and arrange for your CT scan (for abdominal pain).  4. Continue omeprazole 40mg  daily.

## 2019-03-28 ENCOUNTER — Encounter: Payer: Self-pay | Admitting: Gastroenterology

## 2019-03-28 NOTE — Assessment & Plan Note (Signed)
Previously did better on Lomotil which she has been on for 8 years. Resume Lomotil BID. Stop Imodium.

## 2019-03-28 NOTE — Assessment & Plan Note (Signed)
Generalized abdominal pain, abdominal distention with prior history of colon cancer and pancreatic cyst. Will f/u recent admission labs from [facility]. [facility] A/P with contrast once labs reviewed.

## 2019-03-28 NOTE — Assessment & Plan Note (Signed)
Continue omeprazole. Reinforced antireflux measures.

## 2019-03-28 NOTE — Assessment & Plan Note (Signed)
Currently up to date on colonoscopy. Next one planned in 2025.

## 2019-04-21 IMAGING — CT CT ABDOMEN WO/W CM
2 of 12 series · 9 of 46 positions shown, 15 images · IV contrast (Isovue)
Comparison: CT 09/08/2017

CLINICAL DATA: Indeterminate lesion identified on comparison CT.

EXAM:
CT ABDOMEN WITHOUT AND WITH CONTRAST
TECHNIQUE: Multidetector CT imaging of the abdomen was performed following the
standard protocol before and following the bolus administration of
intravenous contrast.
CONTRAST:  75mL IELW4B-ZRR IOPAMIDOL (IELW4B-ZRR) INJECTION 61%

[Series 10: portal thin · axial · portal-venous · 0.74mm/px · z∈[-350,-190]mm · 6 of 112 slices shown, 11 images]
[im 16/112  soft-tissue]
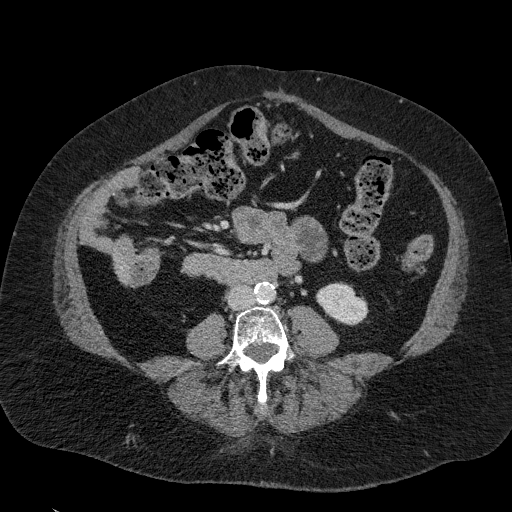
[im 16/112  bone]
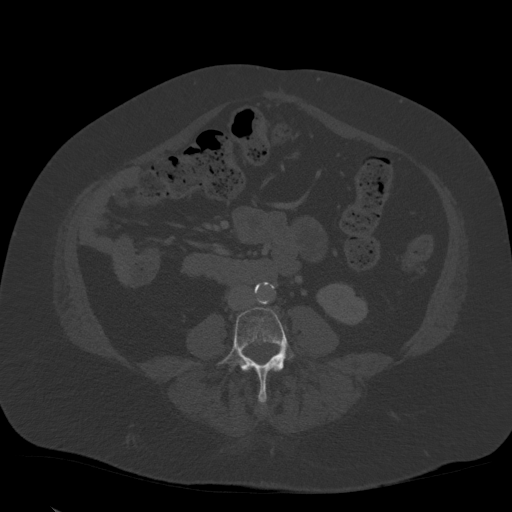
[im 32/112  soft-tissue]
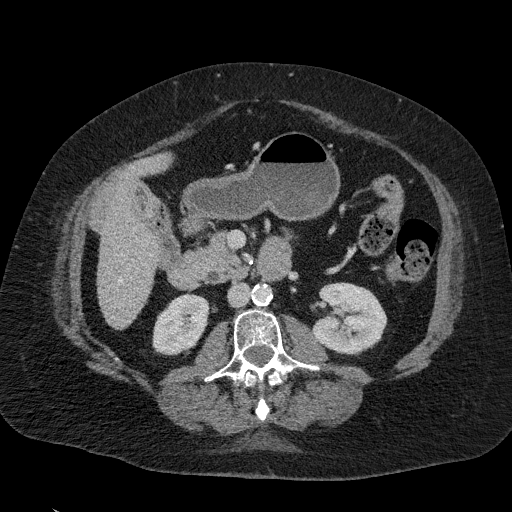
[im 48/112  soft-tissue]
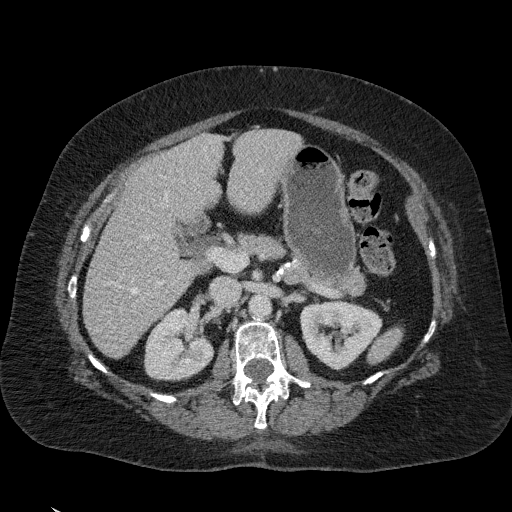
[im 48/112  lung]
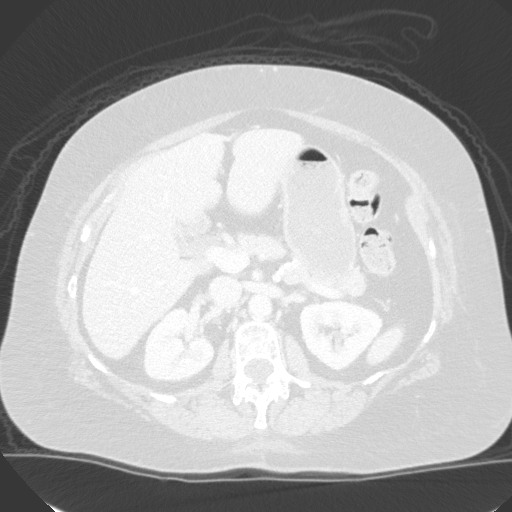
[im 64/112  soft-tissue]
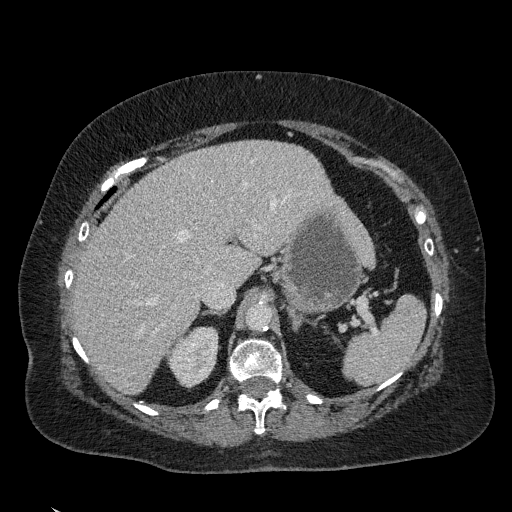
[im 64/112  lung]
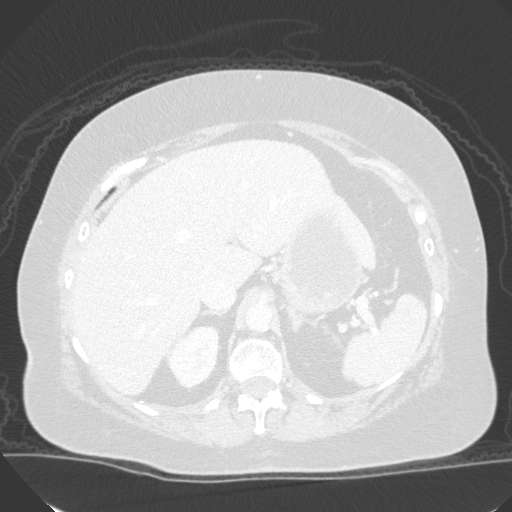
[im 80/112  soft-tissue]
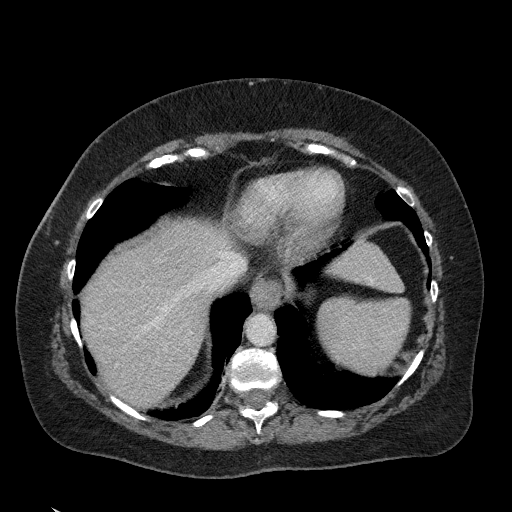
[im 80/112  lung]
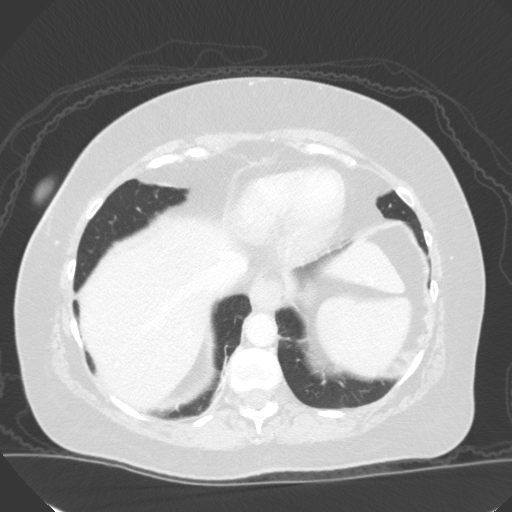
[im 96/112  soft-tissue]
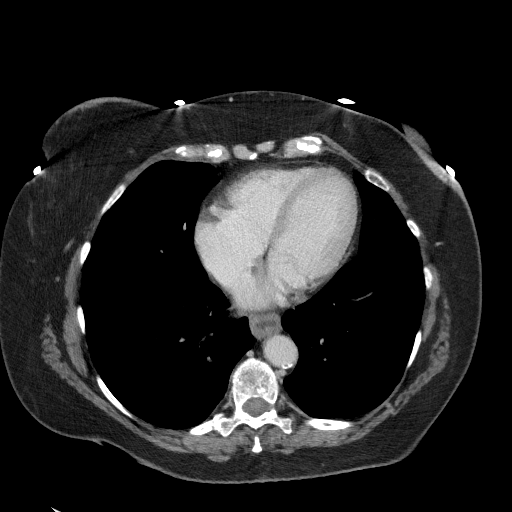
[im 96/112  lung]
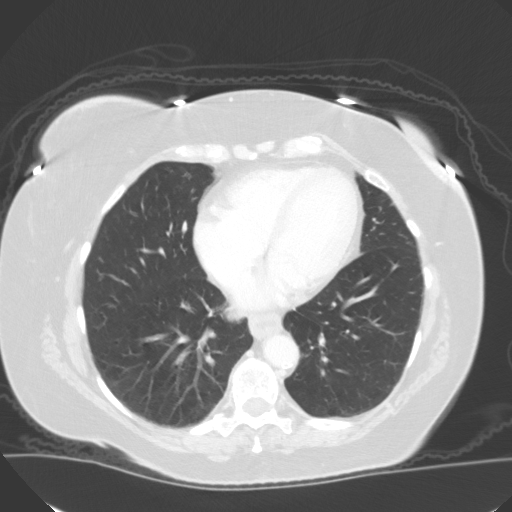

[Series 11: coronal arterial · coronal · arterial · 0.44mm/px · 3 of 101 slices shown, 4 images]
[im 26/101  soft-tissue]
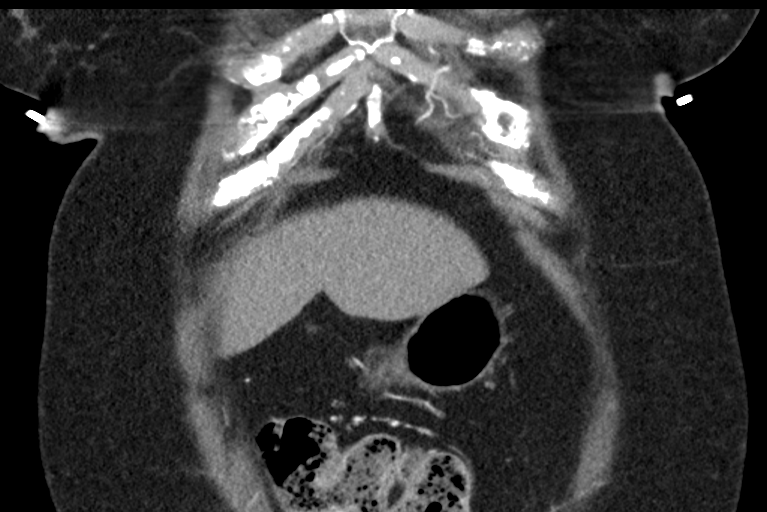
[im 51/101  soft-tissue]
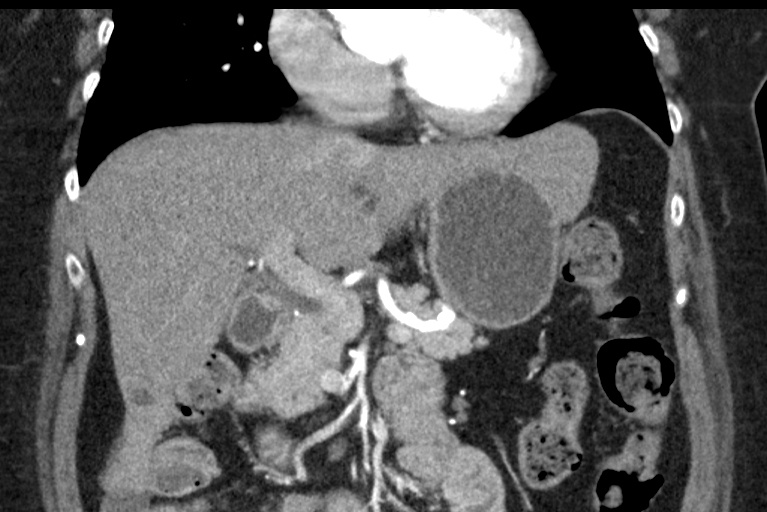
[im 51/101  bone]
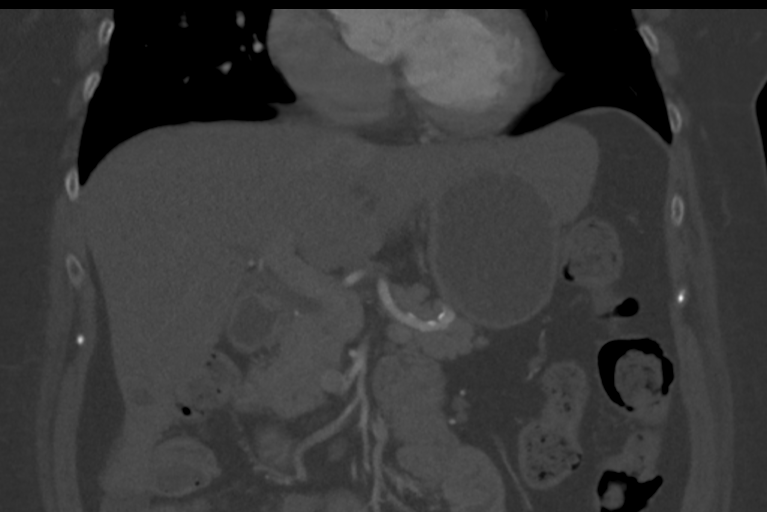
[im 76/101  soft-tissue]
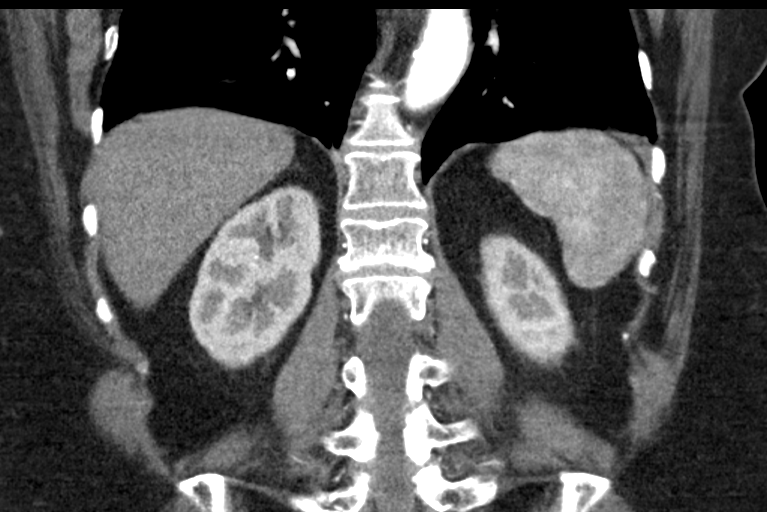

[9 of 46 positions shown; findings below may reference images not displayed]

FINDINGS: Lower chest:  Lung bases are clear.

Hepatobiliary: Small subcapsular early enhancing lesion in the RIGHT
hepatic lobe measures 7 mm (image 44/3). These lesion is hypodense
on portal venous phase imaging. (Image 36/4). No additional hepatic
lesion. Postcholecystectomy. No biliary duct dilatation

Pancreas: Lesion in the mid pancreas along the ventral margin
measures 20 mm x 14 mm (image 50/3) and not changed from 23 x 17 mm
on prior. Lesion demonstrates simple fluid attenuation on
noncontrast studies and post-contrast enhancement on the arterial
phase imaging (image 51/3). Lesion is slightly hypodense on the
portal venous phase imaging. Lesions does not appear to communicate
with the pancreatic duct. No additional pancreatic lesions.

Spleen: Normal spleen.

Adrenals/urinary tract: Adrenal glands and kidneys are normal.

Stomach/Bowel: Anastomosis in transverse colon. Stomach, duodenum
normal. Limited view of the bowel unremarkable.

Vascular/Lymphatic: Abdominal aorta is normal caliber with
atherosclerotic calcification. There is no retroperitoneal or
periportal lymphadenopathy. No pelvic lymphadenopathy.

Musculoskeletal: No aggressive osseous lesion
IMPRESSION: 1. Enhancing cystic lesion along the ventral surface of the mid
pancreas is unchanged in size from comparison exam 7 months prior.
Concern for cystic pancreatic neoplasm or intraductal papillary
mucinous tumor. Recommend endoscopic ultrasound with tissue
sampling. Lesion does abut the stomach.
2. Subcentimeter subcapsular enhancing lesion RIGHT hepatic lobe.
Favor benign lesion. Recommend attention on follow-up. Further
assessment could include contrast MRI.
3.  Aortic Atherosclerosis ([id number]).

These results will be called to the ordering clinician or
representative by the Radiologist Assistant, and communication
documented in the PACS or zVision Dashboard.

## 2019-06-01 ENCOUNTER — Telehealth: Payer: Self-pay | Admitting: Gastroenterology

## 2019-06-01 ENCOUNTER — Encounter: Payer: Self-pay | Admitting: Gastroenterology

## 2019-06-01 NOTE — Telephone Encounter (Signed)
Patient scheduled.

## 2019-06-01 NOTE — Telephone Encounter (Signed)
Reviewed records from [facility] ER visit back in September 2020: Presented with fever over 102, upper respiratory symptoms.  Blood cultures negative CRP greater than 300 Urine culture grew greater than 100,000 CFU E. coli Covid 19 not detected January 27, 2019: Glucose 154 BUN 11 creatinine 0.88, total bilirubin 1, alkaline phosphatase 106, AST 59.7, ALT 71, white blood cell count 12,200, hemoglobin 11.2, MCV 115.9, platelets 202,000  CT chest angio 01/27/2019: Mild distal esophageal thickening with hiatal hernia, mild emphysema, hepatic steatosis, negative for PE  Patient needs ov 06/2019 to follow up abdominal pain, repeat labs, make sure repeat MRI done by [place] or Dr. Paulita Fujita.

## 2019-06-01 NOTE — Telephone Encounter (Signed)
LMOM to call.

## 2019-06-05 NOTE — Telephone Encounter (Signed)
Called, pt unable to take a message and I told the one who answered to have her call at her convenience.

## 2019-06-07 NOTE — Telephone Encounter (Signed)
Received a call from an Dominga Ferry, who says she is pt's POA.  I do not see paperwork in here so she is going to see that pt comes by on Friday before noon to sign form for her to get info. She said the pt is currently at [facility] [employer] and the owners just doubled her rent with very little notice and so she will be having to move.   Pt is not able to comprehend any info and I will be able to discuss with Lelon Frohlich after I have permission.   Sending FYI to Lena and [name].

## 2019-06-07 NOTE — Telephone Encounter (Signed)
[name] remembered this pt has POA papers.  I have [place] back at [phone] and she is aware of the results and plan.  [name], does pt need in office appointment in Feb or virtual.  Please advise!

## 2019-06-07 NOTE — Telephone Encounter (Signed)
Prefer in office unless they desire virtual visit.

## 2019-06-08 NOTE — Telephone Encounter (Signed)
Carla Dunlap is aware and said in office appointment would be fine.  Forwarding to [name] to schedule.

## 2019-06-08 NOTE — Telephone Encounter (Signed)
Pt was already scheduled OV for 2/22 at 9am

## 2019-06-12 ENCOUNTER — Other Ambulatory Visit: Payer: Self-pay | Admitting: Gastroenterology

## 2019-06-12 ENCOUNTER — Telehealth: Payer: Self-pay | Admitting: Gastroenterology

## 2019-06-12 DIAGNOSIS — K219 Gastro-esophageal reflux disease without esophagitis: Secondary | ICD-10-CM

## 2019-06-12 DIAGNOSIS — R197 Diarrhea, unspecified: Secondary | ICD-10-CM

## 2019-06-12 MED ORDER — DIPHENOXYLATE-ATROPINE 2.5-0.025 MG PO TABS: 1.0000 | ORAL_TABLET | Freq: Two times a day (BID) | ORAL | 3 refills | Status: DC

## 2019-06-12 MED ORDER — OMEPRAZOLE 40 MG PO CPDR: 40.0000 mg | DELAYED_RELEASE_CAPSULE | Freq: Every day | ORAL | 5 refills | Status: AC

## 2019-06-12 NOTE — Telephone Encounter (Signed)
Forwarding note to [facility], [place] , who is doing refills this week.  PT has 2 refill request in refill box.

## 2019-06-12 NOTE — Telephone Encounter (Signed)
[facility] called to say patient was switching to [place] and needed refills today.

## 2019-06-12 NOTE — Telephone Encounter (Signed)
Noted. I have tried sending the prescription to [facility] but I am not having any luck. Is there anyway you can call to see if the prescription can be transferred? If not, I will just refuse the refill and send in a new prescription.

## 2019-06-12 NOTE — Telephone Encounter (Signed)
Faxed the printed Rx to [facility].

## 2019-06-12 NOTE — Telephone Encounter (Signed)
This has been addressed. New prescriptions sent to [facility].

## 2019-06-12 NOTE — Telephone Encounter (Signed)
I called [facility] and spoke to pharmacist, Anderson Malta.  She said the only way she knew how to do this is that if we refill and send over she will forward to [place] today. Then we will get requests from [place] for next refills.  Forwarding back to [place].

## 2019-06-12 NOTE — Telephone Encounter (Signed)
Noted. Prior prescriptions were cancelled and new prescriptions sent to [facility]. Printed Rx for Lomotil and will have Waldon Merl fax prescription.

## 2019-06-12 NOTE — Telephone Encounter (Signed)
Opened in error

## 2019-06-13 ENCOUNTER — Telehealth: Payer: Self-pay

## 2019-06-13 NOTE — Telephone Encounter (Signed)
Pt has changed from [facility] to [facility].   Aliene Altes, NP sent the Omeprazole prescription to [facility] yesterday and I faxed the Lomotil.  I received a phone call from pt's caregiver, Burman Nieves, that they were told they did not have any Rx's from [place].  I called and spoke to [facility] at the pharmacy and she said they did receive them.  She thinks what happened, they had received the transfer from [facility] and had bubble packed the meds.  Then our order went over later.  She said they do have them and they are ready.  I called and informed Maggie at [phone].

## 2019-07-03 ENCOUNTER — Encounter: Payer: Self-pay | Admitting: Nurse Practitioner

## 2019-07-03 ENCOUNTER — Other Ambulatory Visit: Payer: Self-pay

## 2019-07-03 ENCOUNTER — Ambulatory Visit (INDEPENDENT_AMBULATORY_CARE_PROVIDER_SITE_OTHER): Payer: Medicare Other | Admitting: Nurse Practitioner

## 2019-07-03 ENCOUNTER — Ambulatory Visit: Payer: PRIVATE HEALTH INSURANCE | Admitting: Gastroenterology

## 2019-07-03 VITALS — BP 114/75 | HR 88 | Temp 96.8°F | Ht 61.0 in | Wt 213.2 lb

## 2019-07-03 DIAGNOSIS — K869 Disease of pancreas, unspecified: Secondary | ICD-10-CM

## 2019-07-03 DIAGNOSIS — K862 Cyst of pancreas: Secondary | ICD-10-CM

## 2019-07-03 DIAGNOSIS — R7989 Other specified abnormal findings of blood chemistry: Secondary | ICD-10-CM

## 2019-07-03 DIAGNOSIS — R1084 Generalized abdominal pain: Secondary | ICD-10-CM

## 2019-07-03 NOTE — Progress Notes (Addendum)
Referring Provider: Denyce Robert, FNP Primary Care Physician:  Denyce Robert, FNP Primary GI:  Dr. Oneida Alar  Chief Complaint  Patient presents with   Abdominal Pain    much better    HPI:   Carla Dunlap is a 69 y.o. female who presents for follow-up on abdominal pain.  The patient was last seen in our office 03/27/2019 for diarrhea, abdominal pain, history of colon cancer stage IV, GERD, weight gain.  Previously on omeprazole for GERD.  EUS in February 2020 showing probable serous cystadenoma with plans for repeat MRI with gadolinium in February 2021.  History of colon cancer status post resection in 2012 as well as chemo and radiation.  Colonoscopy earlier in 2020 with a simple adenoma and 1 hyperplastic polyp, rectal biopsies consistent with rectal prolapse, colon biopsies normal.  Referred to general surgery at [facility] to address rectal prolapse, repeat colonoscopy in 5 years.  The patient saw surgery who was unable to produce rectal prolapse well in the clinic therefore no surgical intervention recommended.  At her last visit she notes diarrhea since surgery and chemo, has been on Lomotil since 2012.  This was recently switched to Imodium by primary care and does not feel it works as well.  Lower abdominal pain better with Lomotil as well.  Reflux doing well.  Notes some bright red blood per rectum with straining and was recently in the hospital at [facility] for which records were requested.  Recommended stop Imodium, start Lomotil twice daily at 8 AM and 8 PM, continue omeprazole 40 mg daily.  Records reviewed as per phone note 05/31/2018, mostly significant for upper respiratory symptoms with fever though noted blood cultures negative, CRP greater than 300, UTI based on culture, COVID-19 not detected.  Mild transaminitis and elevated alk phos at 106.  CT chest angio found mild distal esophageal thickening with hiatal hernia, mild emphysema, hepatic steatosis, negative  for PE.  Recommended office visit in February 2021 for follow-up on abdominal pain, repeat labs, make sure repeat MRI completed by our office or Dr. Paulita Fujita.  Today she states she's doing well overall. Doing much better with loose stools and abdominal pain now that she's back on lomotil. Not taking Imodium any more. Mild, intermittent abdominal discomfort that "I can tolerate." Has new nausea and vomiting. With further clarification is more like morning-time mucous that she "forces herself to get out." Has allergies, on Flonase. See's PCP in April but will call to discuss with them.   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   Vision abnormalities     Past Surgical History:  Procedure Laterality Date   ABDOMINAL HYSTERECTOMY  1999   BIOPSY  06/07/2018   Procedure: BIOPSY;  Surgeon: Danie Binder, MD;  [facility]: [facility];  Service: Endoscopy;;  colon   CESAREAN SECTION     x 2   COLON SURGERY  2012   COLONOSCOPY  2017   NO POLYPS   COLONOSCOPY WITH PROPOFOL N/A 06/07/2018   Dr. Oneida Alar: moderate diverticulosis, 53m  and 75m polyps removed (tubular adenomas), anastomsis normal. inflammation/congestion of rectum (bx c/w rectal prolapse). random colon bx negative.    ESOPHAGOGASTRODUODENOSCOPY N/A 06/15/2018   Procedure: ESOPHAGOGASTRODUODENOSCOPY (EGD);  Surgeon: OArta Silence MD;  [facility];  Service: Endoscopy;  Laterality: N/A;   EUS N/A 06/15/2018   Procedure: UPPER ENDOSCOPIC ULTRASOUND (EUS) LINEAR Have Cipro and redpath available;  Surgeon: OArta Silence MD;  Location: [facility];  Service:  Endoscopy;  Laterality: N/A;   EYE SURGERY     strabismys   POLYPECTOMY  06/07/2018   Procedure: POLYPECTOMY;  Surgeon: FDanie Binder MD;  [facility];  Service: Endoscopy;;  colon    Current Outpatient Medications  Medication Sig Dispense Refill   acetaminophen (TYLENOL) 650 MG CR tablet Take 650 mg by mouth 3 (three) times daily.      diclofenac sodium (VOLTAREN) 1 % GEL Apply 2 g topically every 12 (twelve) hours as needed (for pain).      diphenoxylate-atropine (LOMOTIL) 2.5-0.025 MG tablet Take 1 tablet by mouth 2 (two) times daily. At 8am and 8pm 60 tablet 3   FLUoxetine (PROZAC) 20 MG capsule Take 20 mg by mouth daily.     fluticasone (FLONASE) 50 MCG/ACT nasal spray Place 2 sprays into both nostrils daily.     gabapentin (NEURONTIN) 400 MG capsule Take 400 mg by mouth 3 (three) times daily.     hydrochlorothiazide (HYDRODIURIL) 50 MG tablet Take 50 mg by mouth daily.     hydroxychloroquine (PLAQUENIL) 200 MG tablet Take 200 mg by mouth as needed. Take 1 tablet twice a week as needed for leg cramps     hydroxyurea (HYDREA) 500 MG capsule Take 1,000 mg by mouth daily. May take with food to minimize GI side effects.     levothyroxine (SYNTHROID, LEVOTHROID) 25 MCG tablet Take 25 mcg by mouth daily before breakfast.     Melatonin 3 MG TABS Take 3 mg by mouth at bedtime.      Omega-3 Fatty Acids (FISH OIL) 1000 MG CAPS Take 1 capsule by mouth 3 (three) times daily.     omeprazole (PRILOSEC) 40 MG capsule Take 1 capsule (40 mg total) by mouth daily before breakfast. 30 capsule 5   rOPINIRole (REQUIP) 2 MG tablet Take 2 mg by mouth at bedtime.      traZODone (DESYREL) 50 MG tablet Take 50 mg by mouth at bedtime.     No current facility-administered medications for this visit.    Allergies as of 07/03/2019 - Review Complete 07/03/2019  Allergen Reaction Noted   Anti-inflammatory enzyme [nutritional supplements] Other (See Comments) 08/15/2017   Aspirin  Other (See Comments) 08/15/2017   Nsaids  05/28/2018   Penicillins Other (See Comments) 08/15/2017    Family History  Problem Relation Age of Onset   Scleroderma Mother    Stroke Father    Brain cancer Sister    Obesity Brother    Heart disease Brother    Bone cancer Sister    Colon cancer Neg Hx    Colon polyps Neg Hx     Social History   Socioeconomic History   Marital status: Divorced    Spouse name: Not on file   Number of children: Not on file   Years of education: Not on file   Highest education level: Not on file  Occupational History   Not on file  Tobacco Use   Smoking status: Former Smoker    Packs/day: 0.50    Years: 20.00  Pack years: 10.00    Quit date: 10/22/2017    Years since quitting: 1.6   Smokeless tobacco: Never Used  Substance and Sexual Activity   Alcohol use: Never   Drug use: Never   Sexual activity: Yes    Birth control/protection: Surgical  Other Topics Concern   Not on file  Social History Narrative   Not on file   Social Determinants of Health   Financial Resource Strain:    Difficulty of Paying Living Expenses: Not on file  Food Insecurity:    Worried About [employer] in the Last Year: Not on file   [employer] of Food in the Last Year: Not on file  Transportation Needs:    Lack of Transportation (Medical): Not on file   Lack of Transportation (Non-Medical): Not on file  Physical Activity:    Days of Exercise per Week: Not on file   Minutes of Exercise per Session: Not on file  Stress:    Feeling of Stress : Not on file  Social Connections:    Frequency of Communication with Friends and Family: Not on file   Frequency of Social Gatherings with Friends and Family: Not on file   Attends Religious Services: Not on file   Active Member of Clubs or Organizations: Not on file   Attends Archivist Meetings: Not on file   Marital Status: Not on file    Review of  Systems: General: Negative for anorexia, weight loss, fever, chills, fatigue, weakness. ENT: Negative for hoarseness, difficulty swallowing. CV: Negative for chest pain, angina, palpitations, peripheral edema.  Respiratory: Negative for dyspnea at rest, cough, sputum, wheezing.  GI: See history of present illness. Endo: Negative for unusual weight change.  Heme: Negative for bruising or bleeding. Allergy: Negative for rash or hives.   Physical Exam: BP 114/75    Pulse 88    Temp (!) 96.8 F (36 C) (Temporal)    Ht '5\' 1"'  (1.549 m)    Wt 213 lb 3.2 oz (96.7 kg)    BMI 40.28 kg/m  General:   Alert and oriented. Pleasant and cooperative. Well-nourished and well-developed.  Eyes:  Without icterus, sclera clear and conjunctiva pink.  Ears:  Normal auditory acuity. Cardiovascular:  S1, S2 present without murmurs appreciated. Extremities without clubbing or edema. Respiratory:  Clear to auscultation bilaterally. No wheezes, rales, or rhonchi. No distress.  Gastrointestinal:  +BS, soft, non-tender and non-distended. No HSM noted. No guarding or rebound. No masses appreciated.  Rectal:  Deferred  Musculoskalatal:  Symmetrical without gross deformities. Neurologic:  Alert and oriented x4;  grossly normal neurologically. Psych:  Alert and cooperative. Normal mood and affect. Heme/Lymph/Immune: No excessive bruising noted.    07/03/2019 2:53 PM   Disclaimer: This note was dictated with voice recognition software. Similar sounding words can inadvertently be transcribed and may not be corrected upon review.

## 2019-07-03 NOTE — Addendum Note (Signed)
Addended by: Gordy Levan, Ellakate Gonsalves A on: 07/03/2019 02:54 PM   Modules accepted: Orders

## 2019-07-03 NOTE — Progress Notes (Signed)
Cc'ed to pcp °

## 2019-07-03 NOTE — Assessment & Plan Note (Signed)
Minimal to no abdominal pain.  The abdominal discomfort she does have currently is "tolerable no problem."  She is doing much better on Lomotil.  Not taking Imodium any further.  What was felt to be nausea and vomiting is actually significant morning time sinus drainage with clearing of mucousy material from the back of her throat.  She has a follow-up with primary care soon and recommended she address her seasonal allergies but then to see if any change to her medications could be effective.  Continue Lomotil for now.  Follow-up in 6 months.  Call for any worsening symptoms.

## 2019-07-03 NOTE — Assessment & Plan Note (Signed)
Previously elevated LFTs 05/31/2018 with mild transaminitis and elevated alkaline phosphatase at 106.  Recommended repeat labs.  I will put an order at this time.  Further recommendations to follow.  Follow-up in 6 months in our office.

## 2019-07-03 NOTE — Patient Instructions (Signed)
Your health issues we discussed today were:   Abdominal pain: 1. Continue taking Lomotil 2. Let us know if you have any worsening abdominal pain  Pancreatic cyst: 1. I have put in the order for your MRI 2. Somebody from radiology should call you to schedule your MRI  Previously abnormal labs: 1. Have your labs drawn soon as you can  Overall I recommend:  1. Continue your other current medications 2. Return for follow-up in 6 months 3. Call us for any questions or concerns.   ---------------------------------------------------------------  I am happy that you got your first COVID-19 shot!  Keep your appointment for your second dose.  ---------------------------------------------------------------

## 2019-07-03 NOTE — Assessment & Plan Note (Signed)
Previous MRI with pancreatic cyst.  Endoscopic ultrasound completed in February 2020 which found likely serous cystadenoma planned repeat MRI with gadolinium in February 2021.  She is currently due.  I will enter the order for this to be completed at this time.  Further recommendations to follow.

## 2019-07-13 LAB — CBC WITH DIFFERENTIAL/PLATELET
Absolute Monocytes: 616 cells/uL (ref 200–950)
Basophils Absolute: 38 cells/uL (ref 0–200)
Basophils Relative: 0.7 %
Eosinophils Absolute: 38 cells/uL (ref 15–500)
Eosinophils Relative: 0.7 %
HCT: 34.4 % — ABNORMAL LOW (ref 35.0–45.0)
Hemoglobin: 11.8 g/dL (ref 11.7–15.5)
Lymphs Abs: 950 cells/uL (ref 850–3900)
MCH: 36.6 pg — ABNORMAL HIGH (ref 27.0–33.0)
MCHC: 34.3 g/dL (ref 32.0–36.0)
MCV: 106.8 fL — ABNORMAL HIGH (ref 80.0–100.0)
MPV: 9.4 fL (ref 7.5–12.5)
Monocytes Relative: 11.4 %
Neutro Abs: 3758 cells/uL (ref 1500–7800)
Neutrophils Relative %: 69.6 %
Platelets: 237 10*3/uL (ref 140–400)
RBC: 3.22 10*6/uL — ABNORMAL LOW (ref 3.80–5.10)
RDW: 13.9 % (ref 11.0–15.0)
Total Lymphocyte: 17.6 %
WBC: 5.4 10*3/uL (ref 3.8–10.8)

## 2019-07-13 LAB — COMPREHENSIVE METABOLIC PANEL
AG Ratio: 1.2 (calc) (ref 1.0–2.5)
ALT: 22 U/L (ref 6–29)
AST: 24 U/L (ref 10–35)
Albumin: 3.7 g/dL (ref 3.6–5.1)
Alkaline phosphatase (APISO): 78 U/L (ref 37–153)
BUN: 11 mg/dL (ref 7–25)
CO2: 34 mmol/L — ABNORMAL HIGH (ref 20–32)
Calcium: 9.2 mg/dL (ref 8.6–10.4)
Chloride: 88 mmol/L — ABNORMAL LOW (ref 98–110)
Creat: 0.89 mg/dL (ref 0.60–0.93)
Globulin: 3.1 g/dL (calc) (ref 1.9–3.7)
Glucose, Bld: 127 mg/dL (ref 65–139)
Potassium: 4.5 mmol/L (ref 3.5–5.3)
Sodium: 130 mmol/L — ABNORMAL LOW (ref 135–146)
Total Bilirubin: 0.4 mg/dL (ref 0.2–1.2)
Total Protein: 6.8 g/dL (ref 6.1–8.1)

## 2019-07-21 ENCOUNTER — Other Ambulatory Visit (HOSPITAL_COMMUNITY): Payer: Medicare Other

## 2019-07-25 ENCOUNTER — Ambulatory Visit: Payer: Medicare Other | Admitting: Neurology

## 2019-07-25 NOTE — Progress Notes (Deleted)
PATIENT: Carla Dunlap DOB: 1951-01-10  REASON FOR VISIT: follow up HISTORY FROM: patient  HISTORY OF PRESENT ILLNESS: Today 07/25/19  Carla Dunlap is a 69 year old female with history of seizures and peripheral neuropathy.  She lives in a family care home.  She has history of seizures for about 20 years after a closed head injury.  She has successfully tapered off Topamax, is now on Keppra 750 mg twice a day.  Her last seizure was November 19, 2017 when she ran out of medication.  She does not drive.  She has chemotherapy-induced peripheral neuropathy but symptoms controlled with gabapentin.  She uses diclofenac gel for back pain and Flexeril for muscle spasms.  She has history of opioid abuse.  HISTORY HISTORY OF PRESENT ILLNESS:UPDATE 3/10/2020CM Carla Dunlap, 69 year old female returns for follow-up with history of seizures and peripheral neuropathy.  She currently lives in a family care home.  She has a history of seizures for around 21 years after a closed head injury.  She successfully tapered off of her Topamax after her last visit and is now on Keppra 750 twice daily she has not had any seizure events.  Her last seizure was November 19, 2017 when she ran out of medication.  The patient does not drive.  She denies any falls.  She reports good appetite and sleeping well.  She has a chemotherapy-induced peripheral neuropathy with symptoms controlled with gabapentin 400 mg three  times daily.  She also has a history of back pain Diclofenac gel  and she has Flexeril to take for muscle spasms.  She has a history of opioid abuse.  She returns for reevaluation   REVIEW OF SYSTEMS: Out of a complete 14 system review of symptoms, the patient complains only of the following symptoms, and all other reviewed systems are negative.  ALLERGIES: Allergies  Allergen Reactions  . Anti-Inflammatory Enzyme [Nutritional Supplements] Other (See Comments)    Unknown  . Aspirin Other (See Comments)    Unknown  .  Nsaids   . Penicillins Other (See Comments)    Has patient had a PCN reaction causing immediate rash, facial/tongue/throat swelling, SOB or lightheadedness with hypotension: Yes Has patient had a PCN reaction causing severe rash involving mucus membranes or skin necrosis: Yes Has patient had a PCN reaction that required hospitalization: Yes Has patient had a PCN reaction occurring within the last 10 years: No If all of the above answers are "NO", then may proceed with Cephalosporin use.     HOME MEDICATIONS: Outpatient Medications Prior to Visit  Medication Sig Dispense Refill  . acetaminophen (TYLENOL) 650 MG CR tablet Take 650 mg by mouth 3 (three) times daily.     . diclofenac sodium (VOLTAREN) 1 % GEL Apply 2 g topically every 12 (twelve) hours as needed (for pain).     Marland Kitchen diphenoxylate-atropine (LOMOTIL) 2.5-0.025 MG tablet Take 1 tablet by mouth 2 (two) times daily. At 8am and 8pm 60 tablet 3  . FLUoxetine (PROZAC) 20 MG capsule Take 20 mg by mouth daily.    . fluticasone (FLONASE) 50 MCG/ACT nasal spray Place 2 sprays into both nostrils daily.    Marland Kitchen gabapentin (NEURONTIN) 400 MG capsule Take 400 mg by mouth 3 (three) times daily.    . hydrochlorothiazide (HYDRODIURIL) 50 MG tablet Take 50 mg by mouth daily.    . hydroxychloroquine (PLAQUENIL) 200 MG tablet Take 200 mg by mouth as needed. Take 1 tablet twice a week as needed for leg cramps    .  hydroxyurea (HYDREA) 500 MG capsule Take 1,000 mg by mouth daily. May take with food to minimize GI side effects.    Marland Kitchen levothyroxine (SYNTHROID, LEVOTHROID) 25 MCG tablet Take 25 mcg by mouth daily before breakfast.    . Melatonin 3 MG TABS Take 3 mg by mouth at bedtime.     . Omega-3 Fatty Acids (FISH OIL) 1000 MG CAPS Take 1 capsule by mouth 3 (three) times daily.    Marland Kitchen omeprazole (PRILOSEC) 40 MG capsule Take 1 capsule (40 mg total) by mouth daily before breakfast. 30 capsule 5  . rOPINIRole (REQUIP) 2 MG tablet Take 2 mg by mouth at bedtime.      . traZODone (DESYREL) 50 MG tablet Take 50 mg by mouth at bedtime.     No facility-administered medications prior to visit.    PAST MEDICAL HISTORY: Past Medical History:  Diagnosis Date  . Back pain of lumbar region with sciatica   . Chronic low back pain 03/18/2018  . Colon cancer ([place]) 2012  . Fibromyalgia   . Gait disturbance 03/18/2018  . Heart murmur   . History of kidney stones   . Neuropathy   . Pancreatic cyst    consider serous cystadenoma per EUS 2020  . Peripheral neuropathy 03/18/2018  . 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  . Vision abnormalities     PAST SURGICAL HISTORY: Past Surgical History:  Procedure Laterality Date  . ABDOMINAL HYSTERECTOMY  1999  . BIOPSY  06/07/2018   Procedure: BIOPSY;  Surgeon: Danie Binder, MD;  [facility];  Service: Endoscopy;;  colon  . CESAREAN SECTION     x 2  . COLON SURGERY  2012  . COLONOSCOPY  2017   NO POLYPS  . COLONOSCOPY WITH PROPOFOL N/A 06/07/2018   Dr. Oneida Alar: moderate diverticulosis, 53mm and 47mm polyps removed (tubular adenomas), anastomsis normal. inflammation/congestion of rectum (bx c/w rectal prolapse). random colon bx negative.   . ESOPHAGOGASTRODUODENOSCOPY N/A 06/15/2018   Procedure: ESOPHAGOGASTRODUODENOSCOPY (EGD);  Surgeon: Arta Silence, MD;  Location: [facility];  Service: Endoscopy;  Laterality: N/A;  . EUS N/A 06/15/2018   Procedure: UPPER ENDOSCOPIC ULTRASOUND (EUS) LINEAR Have Cipro and redpath available;  Surgeon: Arta Silence, MD;  Location: [facility];  Service: Endoscopy;  Laterality: N/A;  . EYE SURGERY     strabismys  . POLYPECTOMY  06/07/2018   Procedure: POLYPECTOMY;  Surgeon: Danie Binder, MD;  [facility];  Service: Endoscopy;;  colon    FAMILY HISTORY: Family History  Problem Relation Age of Onset  . Scleroderma Mother   . Stroke Father   . Brain cancer Sister   . Obesity Brother   . Heart disease Brother   . Bone cancer Sister    . Colon cancer Neg Hx   . Colon polyps Neg Hx     SOCIAL HISTORY: Social History   Socioeconomic History  . Marital status: Divorced    Spouse name: Not on file  . Number of children: Not on file  . Years of education: Not on file  . Highest education level: Not on file  Occupational History  . Not on file  Tobacco Use  . Smoking status: Former Smoker    Packs/day: 0.50    Years: 20.00    Pack years: 10.00    Quit date: 10/22/2017    Years since quitting: 1.7  . Smokeless tobacco: Never Used  Substance and Sexual Activity  . Alcohol use:  Never  . Drug use: Never  . Sexual activity: Yes    Birth control/protection: Surgical  Other Topics Concern  . Not on file  Social History Narrative  . Not on file   Social Determinants of Health   Financial Resource Strain:   . Difficulty of Paying Living Expenses:   Food Insecurity:   . Worried About Charity fundraiser in the Last Year:   . Arboriculturist in the Last Year:   Transportation Needs:   . Film/video editor (Medical):   Marland Kitchen Lack of Transportation (Non-Medical):   Physical Activity:   . Days of Exercise per Week:   . Minutes of Exercise per Session:   Stress:   . Feeling of Stress :   Social Connections:   . Frequency of Communication with Friends and Family:   . Frequency of Social Gatherings with Friends and Family:   . Attends Religious Services:   . Active Member of Clubs or Organizations:   . Attends Archivist Meetings:   Marland Kitchen Marital Status:   Intimate Partner Violence:   . Fear of Current or Ex-Partner:   . Emotionally Abused:   Marland Kitchen Physically Abused:   . Sexually Abused:       PHYSICAL EXAM  There were no vitals filed for this visit. There is no height or weight on file to calculate BMI.  Generalized: Well developed, in no acute distress   Neurological examination  Mentation: Alert oriented to time, place, history taking. Follows all commands speech and language fluent Cranial  nerve II-XII: Pupils were equal round reactive to light. Extraocular movements were full, visual field were full on confrontational test. Facial sensation and strength were normal. Uvula tongue midline. Head turning and shoulder shrug  were normal and symmetric. Motor: The motor testing reveals 5 over 5 strength of all 4 extremities. Good symmetric motor tone is noted throughout.  Sensory: Sensory testing is intact to soft touch on all 4 extremities. No evidence of extinction is noted.  Coordination: Cerebellar testing reveals good finger-nose-finger and heel-to-shin bilaterally.  Gait and station: Gait is normal. Tandem gait is normal. Romberg is negative. No drift is seen.  Reflexes: Deep tendon reflexes are symmetric and normal bilaterally.   DIAGNOSTIC DATA (LABS, IMAGING, TESTING) - I reviewed patient records, labs, notes, testing and imaging myself where available.  Lab Results  Component Value Date   WBC 5.4 07/12/2019   HGB 11.8 07/12/2019   HCT 34.4 (L) 07/12/2019   MCV 106.8 (H) 07/12/2019   PLT 237 07/12/2019      Component Value Date/Time   NA 130 (L) 07/12/2019 1431   NA 138 04/05/2018 1147   K 4.5 07/12/2019 1431   CL 88 (L) 07/12/2019 1431   CO2 34 (H) 07/12/2019 1431   GLUCOSE 127 07/12/2019 1431   BUN 11 07/12/2019 1431   BUN 9 04/05/2018 1147   CREATININE 0.89 07/12/2019 1431   CALCIUM 9.2 07/12/2019 1431   PROT 6.8 07/12/2019 1431   ALBUMIN 3.8 05/28/2018 1802   AST 24 07/12/2019 1431   ALT 22 07/12/2019 1431   ALKPHOS 92 05/28/2018 1802   BILITOT 0.4 07/12/2019 1431   GFRNONAA >60 05/28/2018 1802   GFRNONAA 78 10/15/2017 1451   GFRAA >60 05/28/2018 1802   GFRAA 91 10/15/2017 1451   No results found for: CHOL, HDL, LDLCALC, LDLDIRECT, TRIG, CHOLHDL No results found for: HGBA1C Lab Results  Component Value Date   VITAMINB12 660 10/19/2017  No results found for: TSH    ASSESSMENT AND PLAN 69 y.o. year old female  has a past medical history of Back  pain of lumbar region with sciatica, Chronic low back pain (03/18/2018), Colon cancer ([place]) (2012), Fibromyalgia, Gait disturbance (03/18/2018), Heart murmur, History of kidney stones, Neuropathy, Pancreatic cyst, Peripheral neuropathy (03/18/2018), Seizures ([place]), and Vision abnormalities. here with:  1.  History of traumatic brain injury 2.  History of seizures, last seizure November 19, 2017 3.  Chronic low back pain on diclofenac gel 4.  Chemotherapy-induced peripheral neuropathy, on gabapentin 5.  Gait instability due to peripheral neuropathy 6.  History of opiate abuse   I spent 15 minutes with the patient. 50% of this time was spent   Butler Denmark, [place], DNP 07/25/2019, 5:51 AM [facility] [address] [place], [place] [address] [phone]

## 2019-07-26 ENCOUNTER — Encounter: Payer: Self-pay | Admitting: Neurology

## 2019-07-28 ENCOUNTER — Ambulatory Visit (HOSPITAL_COMMUNITY): Payer: Medicare Other

## 2019-08-02 ENCOUNTER — Telehealth: Payer: Self-pay | Admitting: Emergency Medicine

## 2019-08-02 ENCOUNTER — Other Ambulatory Visit: Payer: Self-pay | Admitting: Nurse Practitioner

## 2019-08-02 DIAGNOSIS — R7989 Other specified abnormal findings of blood chemistry: Secondary | ICD-10-CM

## 2019-08-02 DIAGNOSIS — R197 Diarrhea, unspecified: Secondary | ICD-10-CM

## 2019-08-02 NOTE — Telephone Encounter (Signed)
Called pt to notify her that provider sent an order for her to have [facility] at [facility]. No response and was unable to leave vm. Provider also sent her a message on my chart, will mail [facility] lab order.

## 2019-08-03 ENCOUNTER — Telehealth: Payer: Self-pay | Admitting: Emergency Medicine

## 2019-08-03 NOTE — Telephone Encounter (Signed)
Care giver called  and stated she will take Carla Dunlap for the bmp that was ordered yesterday on 3/31. The care giver  is not able to take the pt this week because she has another job. The caregiver also stated that the MRI was rescheduled because the machine is broken and has to be fixed.

## 2019-08-04 NOTE — Telephone Encounter (Signed)
Noted, thanks!

## 2019-08-31 ENCOUNTER — Ambulatory Visit (HOSPITAL_COMMUNITY): Payer: Medicare Other | Attending: Nurse Practitioner

## 2019-09-05 ENCOUNTER — Other Ambulatory Visit: Payer: Self-pay | Admitting: Gastroenterology

## 2019-09-05 DIAGNOSIS — R197 Diarrhea, unspecified: Secondary | ICD-10-CM

## 2020-01-02 ENCOUNTER — Encounter: Payer: Self-pay | Admitting: Internal Medicine

## 2020-01-02 ENCOUNTER — Ambulatory Visit: Payer: Medicare Other | Admitting: Nurse Practitioner

## 2020-01-02 NOTE — Progress Notes (Deleted)
Referring Provider: Denyce Robert, FNP Primary Care Physician:  Denyce Robert, FNP Primary GI:  Dr. Abbey Chatters  No chief complaint on file.   HPI:   Carla Dunlap is a 69 y.o. female who presents for follow-up on abdominal pain.  The patient was last seen in our office 07/03/2019 for the same as well as pancreatic lesion, cyst of pancreas, elevated LFTs.  Noted history of stage IV colon cancer, GERD.  EUS in February 2020 with probable serous cystadenoma plans repeat MRI with gadolinium in February 2021.  Status post surgical resection as well as chemo and radiation for colon cancer in 2012.  Last colonoscopy in 2020 with a simple adenoma and a hyperplastic polyp, rectal biopsies consistent with prolapse, colon biopsies normal.  She was referred to [facility] to address rectal prolapse and recommended repeat colonoscopy in 5 years (2025).  She is all surgery who was unable to reproduce prolapse in the clinic and therefore no surgical intervention recommended.  Previously her diarrhea controlled well on Lomotil but was switched to Imodium which she did not feel worked as well.  Mild transaminitis and elevated alk phos at 106 on previous ER visit at [facility] 05/31/2018.  CT chest angio found mild distal esophageal thickening with hiatal hernia, mild emphysema, hepatic steatosis, negative for PE.  Recommended follow-up and ensure repeat MRI.  At her last visit doing much better now that she is back on Lomotil.  Notes mild intermittent abdominal discomfort as she can tolerate.  No new nausea vomiting.  No other overt GI complaints.  Recommended continue current medications, repeat MRI, labs, follow-up in 6 months.  Labs were completed 07/12/2019.  CBC found normal white blood cell count, normal hemoglobin/stable.  Platelets normal.  CMP found normalized LFTs.  Creatinine normalized at 0.9.  Unfortunately it does not appear MRI was completed as recommended.  Today she states she is doing  okay overall.   Past Medical History:  Diagnosis Date  . Back pain of lumbar region with sciatica   . Chronic low back pain 03/18/2018  . Colon cancer ([place]) 2012  . Fibromyalgia   . Gait disturbance 03/18/2018  . Heart murmur   . History of kidney stones   . Neuropathy   . Pancreatic cyst    consider serous cystadenoma per EUS 2020  . Peripheral neuropathy 03/18/2018  . 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  . Vision abnormalities     Past Surgical History:  Procedure Laterality Date  . ABDOMINAL HYSTERECTOMY  1999  . BIOPSY  06/07/2018   Procedure: BIOPSY;  Surgeon: Danie Binder, MD;  [facility];  Service: Endoscopy;;  colon  . CESAREAN SECTION     x 2  . COLON SURGERY  2012  . COLONOSCOPY  2017   NO POLYPS  . COLONOSCOPY WITH PROPOFOL N/A 06/07/2018   Dr. Oneida Alar: moderate diverticulosis, 66mm and 81mm polyps removed (tubular adenomas), anastomsis normal. inflammation/congestion of rectum (bx c/w rectal prolapse). random colon bx negative.   . ESOPHAGOGASTRODUODENOSCOPY N/A 06/15/2018   Procedure: ESOPHAGOGASTRODUODENOSCOPY (EGD);  Surgeon: Arta Silence, MD;  Location: [facility];  Service: Endoscopy;  Laterality: N/A;  . EUS N/A 06/15/2018   Procedure: UPPER ENDOSCOPIC ULTRASOUND (EUS) LINEAR Have Cipro and redpath available;  Surgeon: Arta Silence, MD;  [facility];  Service: Endoscopy;  Laterality: N/A;  . EYE SURGERY     strabismys  . POLYPECTOMY  06/07/2018   Procedure: POLYPECTOMY;  Surgeon:  Fields, Marga Melnick, MD;  [facility];  Service: Endoscopy;;  colon    Current Outpatient Medications  Medication Sig Dispense Refill  . acetaminophen (TYLENOL) 650 MG CR tablet Take 650 mg by mouth 3 (three) times daily.     . diclofenac sodium (VOLTAREN) 1 % GEL Apply 2 g topically every 12 (twelve) hours as needed (for pain).     Marland Kitchen diphenoxylate-atropine (LOMOTIL) 2.5-0.025 MG tablet TAKE (1) TABLET BY MOUTH TWICE DAILY. (8AM &  8PM) 60 tablet 2  . FLUoxetine (PROZAC) 20 MG capsule Take 20 mg by mouth daily.    . fluticasone (FLONASE) 50 MCG/ACT nasal spray Place 2 sprays into both nostrils daily.    Marland Kitchen gabapentin (NEURONTIN) 400 MG capsule Take 400 mg by mouth 3 (three) times daily.    . hydrochlorothiazide (HYDRODIURIL) 50 MG tablet Take 50 mg by mouth daily.    . hydroxychloroquine (PLAQUENIL) 200 MG tablet Take 200 mg by mouth as needed. Take 1 tablet twice a week as needed for leg cramps    . hydroxyurea (HYDREA) 500 MG capsule Take 1,000 mg by mouth daily. May take with food to minimize GI side effects.    Marland Kitchen levothyroxine (SYNTHROID, LEVOTHROID) 25 MCG tablet Take 25 mcg by mouth daily before breakfast.    . Melatonin 3 MG TABS Take 3 mg by mouth at bedtime.     . Omega-3 Fatty Acids (FISH OIL) 1000 MG CAPS Take 1 capsule by mouth 3 (three) times daily.    Marland Kitchen omeprazole (PRILOSEC) 40 MG capsule Take 1 capsule (40 mg total) by mouth daily before breakfast. 30 capsule 5  . rOPINIRole (REQUIP) 2 MG tablet Take 2 mg by mouth at bedtime.     . traZODone (DESYREL) 50 MG tablet Take 50 mg by mouth at bedtime.     No current facility-administered medications for this visit.    Allergies as of 01/02/2020 - Review Complete 07/03/2019  Allergen Reaction Noted  . Anti-inflammatory enzyme [nutritional supplements] Other (See Comments) 08/15/2017  . Aspirin Other (See Comments) 08/15/2017  . Nsaids  05/28/2018  . Penicillins Other (See Comments) 08/15/2017    Family History  Problem Relation Age of Onset  . Scleroderma Mother   . Stroke Father   . Brain cancer Sister   . Obesity Brother   . Heart disease Brother   . Bone cancer Sister   . Colon cancer Neg Hx   . Colon polyps Neg Hx     Social History   Socioeconomic History  . Marital status: Divorced    Spouse name: Not on file  . Number of children: Not on file  . Years of education: Not on file  . Highest education level: Not on file  Occupational  History  . Not on file  Tobacco Use  . Smoking status: Former Smoker    Packs/day: 0.50    Years: 20.00    Pack years: 10.00    Quit date: 10/22/2017    Years since quitting: 2.1  . Smokeless tobacco: Never Used  Vaping Use  . Vaping Use: Never used  Substance and Sexual Activity  . Alcohol use: Never  . Drug use: Never  . Sexual activity: Yes    Birth control/protection: Surgical  Other Topics Concern  . Not on file  Social History Narrative  . Not on file   Social Determinants of Health   Financial Resource Strain:   . Difficulty of Paying Living Expenses: Not on file  Food Insecurity:   . Worried About Charity fundraiser in the Last Year: Not on file  . Ran Out of Food in the Last Year: Not on file  Transportation Needs:   . Lack of Transportation (Medical): Not on file  . Lack of Transportation (Non-Medical): Not on file  Physical Activity:   . Days of Exercise per Week: Not on file  . Minutes of Exercise per Session: Not on file  Stress:   . Feeling of Stress : Not on file  Social Connections:   . Frequency of Communication with Friends and Family: Not on file  . Frequency of Social Gatherings with Friends and Family: Not on file  . Attends Religious Services: Not on file  . Active Member of Clubs or Organizations: Not on file  . Attends Archivist Meetings: Not on file  . Marital Status: Not on file    Subjective: Review of Systems  Constitutional: Negative for chills, fever, malaise/fatigue and weight loss.  HENT: Negative for congestion and sore throat.   Respiratory: Negative for cough and shortness of breath.   Cardiovascular: Negative for chest pain and palpitations.  Gastrointestinal: Negative for abdominal pain, blood in stool, diarrhea, melena, nausea and vomiting.  Musculoskeletal: Negative for joint pain and myalgias.  Skin: Negative for rash.  Neurological: Negative for dizziness and weakness.  Endo/Heme/Allergies: Does not  bruise/bleed easily.  Psychiatric/Behavioral: Negative for depression. The patient is not nervous/anxious.   All other systems reviewed and are negative.    Objective: There were no vitals taken for this visit. Physical Exam Vitals and nursing note reviewed.  Constitutional:      General: She is not in acute distress.    Appearance: Normal appearance. She is well-developed. She is not ill-appearing, toxic-appearing or diaphoretic.  HENT:     Head: Normocephalic and atraumatic.     Nose: No congestion or rhinorrhea.  Eyes:     General: No scleral icterus. Cardiovascular:     Rate and Rhythm: Normal rate and regular rhythm.     Heart sounds: Normal heart sounds.  Pulmonary:     Effort: Pulmonary effort is normal. No respiratory distress.     Breath sounds: Normal breath sounds.  Abdominal:     General: Bowel sounds are normal.     Palpations: Abdomen is soft. There is no hepatomegaly, splenomegaly or mass.     Tenderness: There is no abdominal tenderness. There is no guarding or rebound.     Hernia: No hernia is present.  Skin:    General: Skin is warm and dry.     Coloration: Skin is not jaundiced.     Findings: No rash.  Neurological:     General: No focal deficit present.     Mental Status: She is alert and oriented to person, place, and time.  Psychiatric:        Attention and Perception: Attention normal.        Mood and Affect: Mood normal.        Speech: Speech normal.        Behavior: Behavior normal.        Thought Content: Thought content normal.        Cognition and Memory: Cognition and memory normal.      Assessment:  ***   Plan: ***      01/02/2020 8:19 AM   Disclaimer: This note was dictated with voice recognition software. Similar sounding words can inadvertently be transcribed and may not be  corrected upon review.

## 2023-05-19 ENCOUNTER — Encounter: Payer: Self-pay | Admitting: *Deleted

## 2023-05-20 ENCOUNTER — Encounter: Payer: Self-pay | Admitting: *Deleted
